# Patient Record
Sex: Female | Born: 1995
Health system: Southern US, Community
[De-identification: ages and names within clinical notes are randomized; demographics above are authoritative.]

## PROBLEM LIST (undated history)

## (undated) DIAGNOSIS — A599 Trichomoniasis, unspecified: Secondary | ICD-10-CM

## (undated) DIAGNOSIS — A749 Chlamydial infection, unspecified: Secondary | ICD-10-CM

## (undated) HISTORY — PX: MOUTH SURGERY: SHX715

---

## 2014-07-15 ENCOUNTER — Encounter (HOSPITAL_BASED_OUTPATIENT_CLINIC_OR_DEPARTMENT_OTHER): Payer: Self-pay | Admitting: *Deleted

## 2014-07-15 DIAGNOSIS — N390 Urinary tract infection, site not specified: Secondary | ICD-10-CM | POA: Insufficient documentation

## 2014-07-15 DIAGNOSIS — Z3202 Encounter for pregnancy test, result negative: Secondary | ICD-10-CM | POA: Insufficient documentation

## 2014-07-15 DIAGNOSIS — N200 Calculus of kidney: Secondary | ICD-10-CM | POA: Insufficient documentation

## 2014-07-15 LAB — URINE MICROSCOPIC-ADD ON

## 2014-07-15 LAB — URINALYSIS, ROUTINE W REFLEX MICROSCOPIC
BILIRUBIN URINE: NEGATIVE
Glucose, UA: NEGATIVE mg/dL
Hgb urine dipstick: NEGATIVE
Ketones, ur: 15 mg/dL — AB
NITRITE: NEGATIVE
Protein, ur: NEGATIVE mg/dL
SPECIFIC GRAVITY, URINE: 1.033 — AB (ref 1.005–1.030)
UROBILINOGEN UA: 1 mg/dL (ref 0.0–1.0)
pH: 6 (ref 5.0–8.0)

## 2014-07-15 LAB — PREGNANCY, URINE: Preg Test, Ur: NEGATIVE

## 2014-07-15 NOTE — ED Notes (Signed)
Pt c/o freq painful urination x 3 days 

## 2014-07-16 ENCOUNTER — Emergency Department (HOSPITAL_BASED_OUTPATIENT_CLINIC_OR_DEPARTMENT_OTHER)
Admission: EM | Admit: 2014-07-16 | Discharge: 2014-07-16 | Disposition: A | Discharge status: Home / Self Care | Payer: Self-pay | Attending: Emergency Medicine | Admitting: Emergency Medicine

## 2014-07-16 DIAGNOSIS — N12 Tubulo-interstitial nephritis, not specified as acute or chronic: Secondary | ICD-10-CM

## 2014-07-16 DIAGNOSIS — N390 Urinary tract infection, site not specified: Secondary | ICD-10-CM

## 2014-07-16 MED ORDER — PHENAZOPYRIDINE HCL 100 MG PO TABS
95.0000 mg | ORAL_TABLET | Freq: Once | ORAL | Status: AC
  Administered 2014-07-16: 100 mg via ORAL
  Filled 2014-07-16: qty 1

## 2014-07-16 MED ORDER — SULFAMETHOXAZOLE-TRIMETHOPRIM 800-160 MG PO TABS: 1.0000 | ORAL_TABLET | Freq: Two times a day (BID) | ORAL | Status: DC

## 2014-07-16 MED ORDER — PHENAZOPYRIDINE HCL 95 MG PO TABS: 95.0000 mg | ORAL_TABLET | Freq: Three times a day (TID) | ORAL | Status: DC

## 2014-07-16 MED ORDER — SULFAMETHOXAZOLE-TRIMETHOPRIM 800-160 MG PO TABS
1.0000 | ORAL_TABLET | Freq: Once | ORAL | Status: AC
  Administered 2014-07-16: 1 via ORAL
  Filled 2014-07-16: qty 1

## 2014-07-16 NOTE — ED Provider Notes (Signed)
CSN: [id number]     Arrival date & time 07/15/14  2314 History   First MD Initiated Contact with Patient 07/16/14 [phone]     Chief Complaint  Patient presents with  . Dysuria     (Consider location/radiation/quality/duration/timing/severity/associated sxs/prior Treatment) Patient is a 19 y.o. female presenting with dysuria. The history is provided by the patient.  Dysuria Pain quality:  Burning Pain severity:  Moderate Onset quality:  Gradual Duration:  3 days Timing:  Constant Progression:  Unchanged Chronicity:  New Recent urinary tract infections: no   Relieved by:  Nothing Worsened by:  Nothing tried Associated symptoms: abdominal pain and fever (tactile)   Associated symptoms: no nausea and no vomiting     History reviewed. No pertinent past medical history. History reviewed. No pertinent past surgical history. History reviewed. No pertinent family history. History  Substance Use Topics  . Smoking status: Never Smoker   . Smokeless tobacco: Not on file  . Alcohol Use: No   OB History    No data available     Review of Systems  Constitutional: Positive for fever (tactile).  Respiratory: Negative for cough and shortness of breath.   Gastrointestinal: Positive for abdominal pain. Negative for nausea and vomiting.  Genitourinary: Positive for dysuria and urgency.  All other systems reviewed and are negative.     Allergies  Naproxen sodium  Home Medications   Prior to Admission medications   Medication Sig Start Date End Date Taking? Authorizing Provider  phenazopyridine (PYRIDIUM) 95 MG tablet Take 1 tablet (95 mg total) by mouth 3 (three) times daily with meals. 07/16/14   Elwin MochaBlair Lyfe Monger, MD  sulfamethoxazole-trimethoprim (BACTRIM DS,SEPTRA DS) 800-160 MG per tablet Take 1 tablet by mouth 2 (two) times daily. 07/16/14   Elwin MochaBlair Angeliz Settlemyre, MD   BP 117/68 mmHg  Pulse 67  Temp(Src) 99 F (37.2 C) (Oral)  Resp 16  Ht 5\' 5"  (1.651 m)  Wt 180 lb (81.647 kg)  BMI  29.95 kg/m2  SpO2 100%  LMP 06/25/2014 Physical Exam  Constitutional: She is oriented to person, place, and time. She appears well-developed and well-nourished. No distress.  HENT:  Head: Normocephalic and atraumatic.  Mouth/Throat: Oropharynx is clear and moist.  Eyes: EOM are normal. Pupils are equal, round, and reactive to light.  Neck: Normal range of motion. Neck supple.  Cardiovascular: Normal rate and regular rhythm.  Exam reveals no friction rub.   No murmur heard. Pulmonary/Chest: Effort normal and breath sounds normal. No respiratory distress. She has no wheezes. She has no rales.  Abdominal: Soft. She exhibits no distension. There is tenderness (Moderate lower abdomen, mild bilateral CVA tenderness). There is no rebound.  Musculoskeletal: Normal range of motion. She exhibits no edema.  Neurological: She is alert and oriented to person, place, and time.  Skin: No rash noted. She is not diaphoretic.  Nursing note and vitals reviewed.   ED Course  Procedures (including critical care time) Labs Review Labs Reviewed  URINALYSIS, ROUTINE W REFLEX MICROSCOPIC - Abnormal; Notable for the following:    APPearance CLOUDY (*)    Specific Gravity, Urine 1.033 (*)    Ketones, ur 15 (*)    Leukocytes, UA MODERATE (*)    All other components within normal limits  URINE MICROSCOPIC-ADD ON - Abnormal; Notable for the following:    Squamous Epithelial / LPF MANY (*)    Bacteria, UA MANY (*)    All other components within normal limits  PREGNANCY, URINE    Imaging Review  No results found.   EKG Interpretation None      MDM   Final diagnoses:  UTI (lower urinary tract infection)  Pyelonephritis    19 year old female here with dysuria, urinary urgency. Has had some intact off fevers and mild back pain. No history of UTIs. No vomiting, diarrhea. No vaginal complaints. Here patient has UTI noted on UA. She does have CVA tenderness bilaterally on exam and some generalized  lower abdominal aching. Will treat for pyelonephritis with Bactrim. Also given Pyridium for her urinary urgency.     Elwin Mocha, MD 07/16/14 [phone]

## 2014-07-16 NOTE — Discharge Instructions (Signed)
Pyelonephritis, Adult °Pyelonephritis is a kidney infection. In general, there are 2 main types of pyelonephritis: °· Infections that come on quickly without any warning (acute pyelonephritis). °· Infections that persist for a long period of time (chronic pyelonephritis). °CAUSES  °Two main causes of pyelonephritis are: °· Bacteria traveling from the bladder to the kidney. This is a problem especially in pregnant women. The urine in the bladder can become filled with bacteria from multiple causes, including: °¨ Inflammation of the prostate gland (prostatitis). °¨ Sexual intercourse in females. °¨ Bladder infection (cystitis). °· Bacteria traveling from the bloodstream to the tissue part of the kidney. °Problems that may increase your risk of getting a kidney infection include: °· Diabetes. °· Kidney stones or bladder stones. °· Cancer. °· Catheters placed in the bladder. °· Other abnormalities of the kidney or ureter. °SYMPTOMS  °· Abdominal pain. °· Pain in the side or flank area. °· Fever. °· Chills. °· Upset stomach. °· Blood in the urine (dark urine). °· Frequent urination. °· Strong or persistent urge to urinate. °· Burning or stinging when urinating. °DIAGNOSIS  °Your caregiver may diagnose your kidney infection based on your symptoms. A urine sample may also be taken. °TREATMENT  °In general, treatment depends on how severe the infection is.  °· If the infection is mild and caught early, your caregiver may treat you with oral antibiotics and send you home. °· If the infection is more severe, the bacteria may have gotten into the bloodstream. This will require intravenous (IV) antibiotics and a hospital stay. Symptoms may include: °¨ High fever. °¨ Severe flank pain. °¨ Shaking chills. °· Even after a hospital stay, your caregiver may require you to be on oral antibiotics for a period of time. °· Other treatments may be required depending upon the cause of the infection. °HOME CARE INSTRUCTIONS  °· Take your  antibiotics as directed. Finish them even if you start to feel better. °· Make an appointment to have your urine checked to make sure the infection is gone. °· Drink enough fluids to keep your urine clear or pale yellow. °· Take medicines for the bladder if you have urgency and frequency of urination as directed by your caregiver. °SEEK IMMEDIATE MEDICAL CARE IF:  °· You have a fever or persistent symptoms for more than 2-3 days. °· You have a fever and your symptoms suddenly get worse. °· You are unable to take your antibiotics or fluids. °· You develop shaking chills. °· You experience extreme weakness or fainting. °· There is no improvement after 2 days of treatment. °MAKE SURE YOU: °· Understand these instructions. °· Will watch your condition. °· Will get help right away if you are not doing well or get worse. °Document Released: 03/20/2005 Document Revised: 09/19/2011 Document Reviewed: 08/24/2010 °ExitCare® Patient Information ©2015 [employer]. This information is not intended to replace advice given to you by your health care provider. Make sure you discuss any questions you have with your health care provider. ° °

## 2014-12-07 ENCOUNTER — Encounter (HOSPITAL_BASED_OUTPATIENT_CLINIC_OR_DEPARTMENT_OTHER): Payer: Self-pay

## 2014-12-07 ENCOUNTER — Emergency Department (HOSPITAL_BASED_OUTPATIENT_CLINIC_OR_DEPARTMENT_OTHER)
Admission: EM | Admit: 2014-12-07 | Discharge: 2014-12-07 | Disposition: A | Discharge status: Home / Self Care | Payer: Self-pay | Attending: Emergency Medicine | Admitting: Emergency Medicine

## 2014-12-07 DIAGNOSIS — Y9289 Other specified places as the place of occurrence of the external cause: Secondary | ICD-10-CM | POA: Insufficient documentation

## 2014-12-07 DIAGNOSIS — Y9389 Activity, other specified: Secondary | ICD-10-CM | POA: Insufficient documentation

## 2014-12-07 DIAGNOSIS — X19XXXA Contact with other heat and hot substances, initial encounter: Secondary | ICD-10-CM | POA: Insufficient documentation

## 2014-12-07 DIAGNOSIS — T23232A Burn of second degree of multiple left fingers (nail), not including thumb, initial encounter: Secondary | ICD-10-CM | POA: Insufficient documentation

## 2014-12-07 DIAGNOSIS — Y998 Other external cause status: Secondary | ICD-10-CM | POA: Insufficient documentation

## 2014-12-07 DIAGNOSIS — T23202A Burn of second degree of left hand, unspecified site, initial encounter: Secondary | ICD-10-CM

## 2014-12-07 MED ORDER — BACITRACIN 500 UNIT/GM EX OINT: 1.0000 "application " | TOPICAL_OINTMENT | Freq: Two times a day (BID) | CUTANEOUS | Status: DC
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
## 2014-12-07 NOTE — Discharge Instructions (Signed)
Burn Care Your skin is a natural barrier to infection. It is the largest organ of your body. Burns damage this natural protection. To help prevent infection, it is very important to follow your caregiver's instructions in the care of your burn. Burns are classified as:  First degree. There is only redness of the skin (erythema). No scarring is expected.  Second degree. There is blistering of the skin. Scarring may occur with deeper burns.  Third degree. All layers of the skin are injured, and scarring is expected. HOME CARE INSTRUCTIONS   Wash your hands well before changing your bandage.  Change your bandage as often as directed by your caregiver.  Remove the old bandage. If the bandage sticks, you may soak it off with cool, clean water.  Cleanse the burn thoroughly but gently with mild soap and water.  Pat the area dry with a clean, dry cloth.  Apply a thin layer of antibacterial cream to the burn.  Apply a clean bandage as instructed by your caregiver.  Keep the bandage as clean and dry as possible.  Elevate the affected area for the first 24 hours, then as instructed by your caregiver.  Only take over-the-counter or prescription medicines for pain, discomfort, or fever as directed by your caregiver. SEEK IMMEDIATE MEDICAL CARE IF:   You develop excessive pain.  You develop redness, tenderness, swelling, or red streaks near the burn.  The burned area develops yellowish-white fluid (pus) or a bad smell.  You have a fever. MAKE SURE YOU:   Understand these instructions.  Will watch your condition.  Will get help right away if you are not doing well or get worse. Document Released: 03/20/2005 Document Revised: 06/12/2011 Document Reviewed: 08/10/2010 ExitCare Patient Information 2015 [employer]. This information is not intended to replace advice given to you by your health care provider. Make sure you discuss any questions you have with your health care  provider.  
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
## 2014-12-07 NOTE — ED Notes (Signed)
Wound care discussed and pt verbalized understanding. Rx x 1 given for bacitracin

## 2015-07-12 ENCOUNTER — Emergency Department (HOSPITAL_COMMUNITY): Payer: Self-pay

## 2015-07-12 ENCOUNTER — Emergency Department (HOSPITAL_COMMUNITY)
Admission: EM | Admit: 2015-07-12 | Discharge: 2015-07-12 | Disposition: A | Discharge status: Home / Self Care | Payer: Self-pay | Attending: Emergency Medicine | Admitting: Emergency Medicine

## 2015-07-12 ENCOUNTER — Encounter (HOSPITAL_COMMUNITY): Payer: Self-pay | Admitting: Emergency Medicine

## 2015-07-12 DIAGNOSIS — N760 Acute vaginitis: Secondary | ICD-10-CM | POA: Insufficient documentation

## 2015-07-12 DIAGNOSIS — Z79899 Other long term (current) drug therapy: Secondary | ICD-10-CM | POA: Insufficient documentation

## 2015-07-12 DIAGNOSIS — N939 Abnormal uterine and vaginal bleeding, unspecified: Secondary | ICD-10-CM | POA: Insufficient documentation

## 2015-07-12 DIAGNOSIS — B9689 Other specified bacterial agents as the cause of diseases classified elsewhere: Secondary | ICD-10-CM

## 2015-07-12 DIAGNOSIS — Z3202 Encounter for pregnancy test, result negative: Secondary | ICD-10-CM | POA: Insufficient documentation

## 2015-07-12 DIAGNOSIS — R1031 Right lower quadrant pain: Secondary | ICD-10-CM

## 2015-07-12 LAB — I-STAT CHEM 8, ED
BUN: 16 mg/dL (ref 6–20)
CHLORIDE: 104 mmol/L (ref 101–111)
Calcium, Ion: 1.14 mmol/L (ref 1.12–1.23)
Creatinine, Ser: 0.8 mg/dL (ref 0.44–1.00)
GLUCOSE: 81 mg/dL (ref 65–99)
HCT: 39 % (ref 36.0–46.0)
Hemoglobin: 13.3 g/dL (ref 12.0–15.0)
POTASSIUM: 3.7 mmol/L (ref 3.5–5.1)
Sodium: 138 mmol/L (ref 135–145)
TCO2: 23 mmol/L (ref 0–100)

## 2015-07-12 LAB — URINALYSIS, ROUTINE W REFLEX MICROSCOPIC
Bilirubin Urine: NEGATIVE
Glucose, UA: NEGATIVE mg/dL
Ketones, ur: NEGATIVE mg/dL
Nitrite: NEGATIVE
Protein, ur: 30 mg/dL — AB
SPECIFIC GRAVITY, URINE: 1.039 — AB (ref 1.005–1.030)
pH: 6 (ref 5.0–8.0)

## 2015-07-12 LAB — WET PREP, GENITAL
Sperm: NONE SEEN
Trich, Wet Prep: NONE SEEN
YEAST WET PREP: NONE SEEN

## 2015-07-12 LAB — CBC
HCT: 36.3 % (ref 36.0–46.0)
HEMOGLOBIN: 12 g/dL (ref 12.0–15.0)
MCH: 28.6 pg (ref 26.0–34.0)
MCHC: 33.1 g/dL (ref 30.0–36.0)
MCV: 86.4 fL (ref 78.0–100.0)
Platelets: 417 10*3/uL — ABNORMAL HIGH (ref 150–400)
RBC: 4.2 MIL/uL (ref 3.87–5.11)
RDW: 14.2 % (ref 11.5–15.5)
WBC: 8.1 10*3/uL (ref 4.0–10.5)

## 2015-07-12 LAB — URINE MICROSCOPIC-ADD ON

## 2015-07-12 LAB — I-STAT BETA HCG BLOOD, ED (MC, WL, AP ONLY): I-stat hCG, quantitative: <5 m[IU]/mL (ref <5)

## 2015-07-12 MED ORDER — METRONIDAZOLE 500 MG PO TABS: 500.0000 mg | ORAL_TABLET | Freq: Two times a day (BID) | ORAL | Status: DC

## 2015-07-12 NOTE — ED Provider Notes (Signed)
CSN: [id number]     Arrival date & time 07/12/15  1326 History   First MD Initiated Contact with Patient 07/12/15 1621     Chief Complaint  Patient presents with  . Vaginal Bleeding     (Consider location/radiation/quality/duration/timing/severity/associated sxs/prior Treatment) HPI Comments: Patient presents to the emergency department with chief complaint of abnormal vaginal bleeding. She states that she began bleeding last night. States the bleeding was heavy last night, but it has slowed down this morning, and has nearly resolved. She states that she became concerned because she just finished her period on 3/24. She states that she is normally very regular. She reports some associated lower abdominal cramping. She denies any fevers, chills, nausea, vomiting, or diarrhea. Additionally, she reports some associated dysuria. She states that she did try to notify her OB/GYN, but was told that her insurance was not covered.  The history is provided by the patient. No language interpreter was used.    History reviewed. No pertinent past medical history. Past Surgical History  Procedure Laterality Date  . Mouth surgery     History reviewed. No pertinent family history. Social History  Substance Use Topics  . Smoking status: Never Smoker   . Smokeless tobacco: None  . Alcohol Use: No   OB History    No data available     Review of Systems  Constitutional: Negative for fever and chills.  Respiratory: Negative for shortness of breath.   Cardiovascular: Negative for chest pain.  Gastrointestinal: Negative for nausea, vomiting, diarrhea and constipation.  Genitourinary: Positive for vaginal bleeding. Negative for dysuria.  All other systems reviewed and are negative.     Allergies  Naproxen sodium  Home Medications   Prior to Admission medications   Medication Sig Start Date End Date Taking? Authorizing Provider  LO LOESTRIN FE 1 MG-10 MCG / 10 MCG tablet Take 1 tablet by  mouth daily. 06/29/15   Historical Provider, MD   BP 109/72 mmHg  Pulse 60  Temp(Src) 98.3 F (36.8 C) (Oral)  Resp 14  SpO2 98%  LMP 06/25/2015 Physical Exam  Constitutional: She is oriented to person, place, and time. She appears well-developed and well-nourished.  HENT:  Head: Normocephalic and atraumatic.  Eyes: Conjunctivae and EOM are normal. Pupils are equal, round, and reactive to light.  Neck: Normal range of motion. Neck supple.  Cardiovascular: Normal rate and regular rhythm.  Exam reveals no gallop and no friction rub.   No murmur heard. Pulmonary/Chest: Effort normal and breath sounds normal. No respiratory distress. She has no wheezes. She has no rales. She exhibits no tenderness.  Abdominal: Soft. Bowel sounds are normal. She exhibits no distension and no mass. There is no tenderness. There is no rebound and no guarding.  Genitourinary:  Pelvic exam chaperoned by female ER tech, mild right adnexal tenderness, no left adnexal tenderness, no uterine tenderness, no vaginal discharge, moderate old blood, no active bleeding, no CMT or friability, no foreign body, no injury to the external genitalia, no other significant findings   Musculoskeletal: Normal range of motion. She exhibits no edema or tenderness.  Neurological: She is alert and oriented to person, place, and time.  Skin: Skin is warm and dry.  Psychiatric: She has a normal mood and affect. Her behavior is normal. Judgment and thought content normal.  Nursing note and vitals reviewed.   ED Course  Procedures (including critical care time) Labs Review Labs Reviewed  WET PREP, GENITAL - Abnormal; Notable for the following:  Clue Cells Wet Prep HPF POC PRESENT (*)    WBC, Wet Prep HPF POC MODERATE (*)    All other components within normal limits  CBC - Abnormal; Notable for the following:    Platelets 417 (*)    All other components within normal limits  URINALYSIS, ROUTINE W REFLEX MICROSCOPIC (NOT AT [facility])  - Abnormal; Notable for the following:    Color, Urine AMBER (*)    APPearance CLOUDY (*)    Specific Gravity, Urine 1.039 (*)    Hgb urine dipstick SMALL (*)    Protein, ur 30 (*)    Leukocytes, UA LARGE (*)    All other components within normal limits  URINE MICROSCOPIC-ADD ON - Abnormal; Notable for the following:    Squamous Epithelial / LPF 0-5 (*)    Bacteria, UA MANY (*)    All other components within normal limits  I-STAT CHEM 8, ED  I-STAT BETA HCG BLOOD, ED ([facility], [facility], [facility] ONLY)  GC/CHLAMYDIA PROBE AMP ([place]) NOT AT [facility]    Imaging Review [place] Transvaginal Non-ob  07/12/2015  CLINICAL DATA:  RIGHT lower quadrant abdominal pain, heavy bleeding since last night EXAM: TRANSABDOMINAL AND TRANSVAGINAL ULTRASOUND OF PELVIS DOPPLER ULTRASOUND OF OVARIES TECHNIQUE: Both transabdominal and transvaginal ultrasound examinations of the pelvis were performed. Transabdominal technique was performed for global imaging of the pelvis including uterus, ovaries, adnexal regions, and pelvic cul-de-sac. It was necessary to proceed with endovaginal exam following the transabdominal exam to visualize the endometrium and ovaries. Color and duplex Doppler ultrasound was utilized to evaluate blood flow to the ovaries. COMPARISON:  None FINDINGS: Uterus Measurements: 4.6 x 2.3 x 3.7 cm. Normal morphology without mass. Endometrium Thickness: 1 mm thick. Tiny amount of endometrial fluid. No other focal abnormalities. Right ovary Measurements: 2.1 x 1.8 x 2.7 cm. Normal morphology without mass. Internal blood flow present on color Doppler imaging. Left ovary Measurements: 2.0 x 2.5 x 2.7 cm. Normal morphology without mass. Internal blood flow present on color Doppler imaging. Pulsed Doppler evaluation of both ovaries demonstrates normal low-resistance arterial and venous waveforms. Other findings Trace free pelvic fluid. No adnexal masses. IMPRESSION: Tiny amount of nonspecific endometrial fluid. Otherwise normal  exam including duplex assessment of the ovaries. Electronically Signed   By: Ulyses SouthwardMark  Boles M.D.   On: 07/12/2015 19:44   [place] Pelvis Complete  07/12/2015  CLINICAL DATA:  RIGHT lower quadrant abdominal pain, heavy bleeding since last night EXAM: TRANSABDOMINAL AND TRANSVAGINAL ULTRASOUND OF PELVIS DOPPLER ULTRASOUND OF OVARIES TECHNIQUE: Both transabdominal and transvaginal ultrasound examinations of the pelvis were performed. Transabdominal technique was performed for global imaging of the pelvis including uterus, ovaries, adnexal regions, and pelvic cul-de-sac. It was necessary to proceed with endovaginal exam following the transabdominal exam to visualize the endometrium and ovaries. Color and duplex Doppler ultrasound was utilized to evaluate blood flow to the ovaries. COMPARISON:  None FINDINGS: Uterus Measurements: 4.6 x 2.3 x 3.7 cm. Normal morphology without mass. Endometrium Thickness: 1 mm thick. Tiny amount of endometrial fluid. No other focal abnormalities. Right ovary Measurements: 2.1 x 1.8 x 2.7 cm. Normal morphology without mass. Internal blood flow present on color Doppler imaging. Left ovary Measurements: 2.0 x 2.5 x 2.7 cm. Normal morphology without mass. Internal blood flow present on color Doppler imaging. Pulsed Doppler evaluation of both ovaries demonstrates normal low-resistance arterial and venous waveforms. Other findings Trace free pelvic fluid. No adnexal masses. IMPRESSION: Tiny amount of nonspecific endometrial fluid. Otherwise normal exam including duplex assessment  of the ovaries. Electronically Signed   By: Ulyses Southward M.D.   On: 07/12/2015 19:44   [place] Art/ven Flow Abd Pelv Doppler  07/12/2015  CLINICAL DATA:  RIGHT lower quadrant abdominal pain, heavy bleeding since last night EXAM: TRANSABDOMINAL AND TRANSVAGINAL ULTRASOUND OF PELVIS DOPPLER ULTRASOUND OF OVARIES TECHNIQUE: Both transabdominal and transvaginal ultrasound examinations of the pelvis were performed. Transabdominal  technique was performed for global imaging of the pelvis including uterus, ovaries, adnexal regions, and pelvic cul-de-sac. It was necessary to proceed with endovaginal exam following the transabdominal exam to visualize the endometrium and ovaries. Color and duplex Doppler ultrasound was utilized to evaluate blood flow to the ovaries. COMPARISON:  None FINDINGS: Uterus Measurements: 4.6 x 2.3 x 3.7 cm. Normal morphology without mass. Endometrium Thickness: 1 mm thick. Tiny amount of endometrial fluid. No other focal abnormalities. Right ovary Measurements: 2.1 x 1.8 x 2.7 cm. Normal morphology without mass. Internal blood flow present on color Doppler imaging. Left ovary Measurements: 2.0 x 2.5 x 2.7 cm. Normal morphology without mass. Internal blood flow present on color Doppler imaging. Pulsed Doppler evaluation of both ovaries demonstrates normal low-resistance arterial and venous waveforms. Other findings Trace free pelvic fluid. No adnexal masses. IMPRESSION: Tiny amount of nonspecific endometrial fluid. Otherwise normal exam including duplex assessment of the ovaries. Electronically Signed   By: Ulyses Southward M.D.   On: 07/12/2015 19:44   I have personally reviewed and evaluated these images and lab results as part of my medical decision-making.   MDM   Final diagnoses:  Abnormal uterine bleeding  BV (bacterial vaginosis)    Patient with vaginal bleeding since yesterday.  No focal abdominal tenderness.  Will check labs and pelvic exam.  No active bleeding on pelvic exam. She does have some right-sided adnexal tenderness, will check pelvic ultrasound. hCG is negative.  There are clue cells present on wet prep. We'll need to treat for bacterial vaginosis.  Hemoglobin and hematocrit are normal. Vital signs stable. Ultrasound is negative. Given return precautions and follow-up instructions.    Roxy Horseman, PA-C 07/12/15 1958  Raeford Razor, MD 07/13/15 [id number]

## 2015-07-12 NOTE — Discharge Instructions (Signed)
Abnormal Uterine Bleeding °Abnormal uterine bleeding can affect women at various stages in life, including teenagers, women in their reproductive years, pregnant women, and women who have reached menopause. Several kinds of uterine bleeding are considered abnormal, including: °· Bleeding or spotting between periods.   °· Bleeding after sexual intercourse.   °· Bleeding that is heavier or more than normal.   °· Periods that last longer than usual. °· Bleeding after menopause.   °Many cases of abnormal uterine bleeding are minor and simple to treat, while others are more serious. Any type of abnormal bleeding should be evaluated by your health care provider. Treatment will depend on the cause of the bleeding. °HOME CARE INSTRUCTIONS °Monitor your condition for any changes. The following actions may help to alleviate any discomfort you are experiencing: °· Avoid the use of tampons and douches as directed by your health care provider. °· Change your pads frequently. °You should get regular pelvic exams and Pap tests. Keep all follow-up appointments for diagnostic tests as directed by your health care provider.  °SEEK MEDICAL CARE IF:  °· Your bleeding lasts more than 1 week.   °· You feel dizzy at times.   °SEEK IMMEDIATE MEDICAL CARE IF:  °· You pass out.   °· You are changing pads every 15 to 30 minutes.   °· You have abdominal pain. °· You have a fever.   °· You become sweaty or weak.   °· You are passing large blood clots from the vagina.   °· You start to feel nauseous and vomit. °MAKE SURE YOU:  °· Understand these instructions. °· Will watch your condition. °· Will get help right away if you are not doing well or get worse. °  °This information is not intended to replace advice given to you by your health care provider. Make sure you discuss any questions you have with your health care provider. °  °Document Released: 03/20/2005 Document Revised: 03/25/2013 Document Reviewed: 10/17/2012 °Elsevier Interactive  Patient Education ©2016 Elsevier Inc. °Bacterial Vaginosis °Bacterial vaginosis is a vaginal infection that occurs when the normal balance of bacteria in the vagina is disrupted. It results from an overgrowth of certain bacteria. This is the most common vaginal infection in women of childbearing age. Treatment is important to prevent complications, especially in pregnant women, as it can cause a premature delivery. °CAUSES  °Bacterial vaginosis is caused by an increase in harmful bacteria that are normally present in smaller amounts in the vagina. Several different kinds of bacteria can cause bacterial vaginosis. However, the reason that the condition develops is not fully understood. °RISK FACTORS °Certain activities or behaviors can put you at an increased risk of developing bacterial vaginosis, including: °· Having a new sex partner or multiple sex partners. °· Douching. °· Using an intrauterine device (IUD) for contraception. °Women do not get bacterial vaginosis from toilet seats, bedding, swimming pools, or contact with objects around them. °SIGNS AND SYMPTOMS  °Some women with bacterial vaginosis have no signs or symptoms. Common symptoms include: °· Grey vaginal discharge. °· A fishlike odor with discharge, especially after sexual intercourse. °· Itching or burning of the vagina and vulva. °· Burning or pain with urination. °DIAGNOSIS  °Your health care provider will take a medical history and examine the vagina for signs of bacterial vaginosis. A sample of vaginal fluid may be taken. Your health care provider will look at this sample under a microscope to check for bacteria and abnormal cells. A vaginal pH test may also be done.  °TREATMENT  °Bacterial vaginosis may be treated   with antibiotic medicines. These may be given in the form of a pill or a vaginal cream. A second round of antibiotics may be prescribed if the condition comes back after treatment. Because bacterial vaginosis increases your risk for  sexually transmitted diseases, getting treated can help reduce your risk for chlamydia, gonorrhea, HIV, and herpes. °HOME CARE INSTRUCTIONS  °· Only take over-the-counter or prescription medicines as directed by your health care provider. °· If antibiotic medicine was prescribed, take it as directed. Make sure you finish it even if you start to feel better. °· Tell all sexual partners that you have a vaginal infection. They should see their health care provider and be treated if they have problems, such as a mild rash or itching. °· During treatment, it is important that you follow these instructions: °¨ Avoid sexual activity or use condoms correctly. °¨ Do not douche. °¨ Avoid alcohol as directed by your health care provider. °¨ Avoid breastfeeding as directed by your health care provider. °SEEK MEDICAL CARE IF:  °· Your symptoms are not improving after 3 days of treatment. °· You have increased discharge or pain. °· You have a fever. °MAKE SURE YOU:  °· Understand these instructions. °· Will watch your condition. °· Will get help right away if you are not doing well or get worse. °FOR MORE INFORMATION  °Centers for Disease Control and Prevention, Division of STD Prevention: www.cdc.gov/std °American Sexual Health Association (ASHA): www.ashastd.org  °  °This information is not intended to replace advice given to you by your health care provider. Make sure you discuss any questions you have with your health care provider. °  °Document Released: 03/20/2005 Document Revised: 04/10/2014 Document Reviewed: 10/30/2012 °Elsevier Interactive Patient Education ©2016 Elsevier Inc. ° °

## 2015-07-12 NOTE — Progress Notes (Signed)
Patient listed as not having a pcp or insurance.  EDCM spoke to patient at bedside.  Patient reports she has Medicaid Family Planning and sees her obgyn Dr. Ferdinand C[place]Arus in [place].  Patient does not have a pcp.  [facility], informed patient of services there.  EDCM also provided patient with list of pcps who accept self pay patients, list of discount pharmacies and websites needymeds.org and GoodRX.com for medication assistance, phone number to inquire about the orange card, phone number to inquire about Mediciad, phone number to inquire about the Affordable Care Act, financial resources in the community such as local churches, salvation [employer], urban ministries, and dental assistance for uninsured patients.  Patient thankful for resources.  No further EDCM needs at this time.

## 2015-07-12 NOTE — ED Notes (Signed)
Pt states heavy vaginal bleeding beginning last night into today. States it's not time for her period, and that those do come fairly regularly. Last period being March 24th. Pt states she noticed one larger clot in the bleeding. States she does occasionally have unprotected sex with her boyfriend.

## 2015-07-13 ENCOUNTER — Emergency Department (HOSPITAL_BASED_OUTPATIENT_CLINIC_OR_DEPARTMENT_OTHER)
Admission: EM | Admit: 2015-07-13 | Discharge: 2015-07-13 | Disposition: A | Discharge status: Home / Self Care | Payer: Medicaid Other | Attending: Emergency Medicine | Admitting: Emergency Medicine

## 2015-07-13 ENCOUNTER — Encounter (HOSPITAL_BASED_OUTPATIENT_CLINIC_OR_DEPARTMENT_OTHER): Payer: Self-pay | Admitting: *Deleted

## 2015-07-13 DIAGNOSIS — M791 Myalgia: Secondary | ICD-10-CM | POA: Insufficient documentation

## 2015-07-13 DIAGNOSIS — Z792 Long term (current) use of antibiotics: Secondary | ICD-10-CM | POA: Insufficient documentation

## 2015-07-13 DIAGNOSIS — H9201 Otalgia, right ear: Secondary | ICD-10-CM | POA: Insufficient documentation

## 2015-07-13 DIAGNOSIS — J020 Streptococcal pharyngitis: Secondary | ICD-10-CM | POA: Insufficient documentation

## 2015-07-13 LAB — GC/CHLAMYDIA PROBE AMP (~~LOC~~) NOT AT ARMC
CHLAMYDIA, DNA PROBE: NEGATIVE
Neisseria Gonorrhea: POSITIVE — AB

## 2015-07-13 LAB — RAPID STREP SCREEN (MED CTR MEBANE ONLY): STREPTOCOCCUS, GROUP A SCREEN (DIRECT): POSITIVE — AB

## 2015-07-13 MED ORDER — ACETAMINOPHEN 325 MG PO TABS
650.0000 mg | ORAL_TABLET | Freq: Once | ORAL | Status: AC
  Administered 2015-07-13: 650 mg via ORAL
  Filled 2015-07-13: qty 2

## 2015-07-13 MED ORDER — MAGIC MOUTHWASH
ORAL | Status: AC
  Filled 2015-07-13: qty 10

## 2015-07-13 MED ORDER — PENICILLIN G BENZATHINE 1200000 UNIT/2ML IM SUSP
1.2000 10*6.[IU] | Freq: Once | INTRAMUSCULAR | Status: AC
  Administered 2015-07-13: 1.2 10*6.[IU] via INTRAMUSCULAR
  Filled 2015-07-13: qty 2

## 2015-07-13 MED ORDER — KETOROLAC TROMETHAMINE 30 MG/ML IJ SOLN
30.0000 mg | Freq: Once | INTRAMUSCULAR | Status: AC
Start: 2015-07-13 — End: 2015-07-13
  Administered 2015-07-13: 30 mg via INTRAMUSCULAR
  Filled 2015-07-13: qty 1

## 2015-07-13 MED ORDER — PENICILLIN G BENZATHINE 1200000 UNIT/2ML IM SUSP
2.4000 10*6.[IU] | Freq: Once | INTRAMUSCULAR | Status: DC
  Filled 2015-07-13: qty 4

## 2015-07-13 MED ORDER — MAGIC MOUTHWASH
5.0000 mL | Freq: Once | ORAL | Status: AC
  Administered 2015-07-13: 5 mL via ORAL

## 2015-07-13 NOTE — ED Provider Notes (Signed)
CSN: [id number]     Arrival date & time 07/13/15  0758 History   First MD Initiated Contact with Patient 07/13/15 [id number]     No chief complaint on file.    (Consider location/radiation/quality/duration/timing/severity/associated sxs/prior Treatment) HPI Comments: 20 year old female with no significant past medical history presents for throat pain and myalgias. The patient reports that she felt okay yesterday morning when [facility]. She says that since last night she has developed severe pain in her throat worse on the right side and pain in her right ear. She reports feeling feverish, having body aches, having chills. Her roommate was sick recently with similar symptoms. Denies vomiting. No abdominal pain. No cough.   History reviewed. No pertinent past medical history. Past Surgical History  Procedure Laterality Date  . Mouth surgery     No family history on file. Social History  Substance Use Topics  . Smoking status: Never Smoker   . Smokeless tobacco: None  . Alcohol Use: No   OB History    No data available     Review of Systems  Constitutional: Positive for fever, chills and fatigue.  HENT: Positive for ear pain and sore throat. Negative for congestion, postnasal drip, sinus pressure, sneezing, trouble swallowing and voice change.   Eyes: Negative for pain and redness.  Respiratory: Negative for cough, shortness of breath and wheezing.   Gastrointestinal: Negative for nausea, vomiting, abdominal pain and diarrhea.  Genitourinary: Negative for dysuria, urgency and hematuria.  Musculoskeletal: Positive for myalgias. Negative for back pain.  Skin: Negative for rash.  Neurological: Negative for dizziness, seizures, weakness and numbness.  Hematological: Does not bruise/bleed easily.      Allergies  Naproxen sodium  Home Medications   Prior to Admission medications   Medication Sig Start Date End Date Taking? Authorizing Provider   LO LOESTRIN FE 1 MG-10 MCG / 10 MCG tablet Take 1 tablet by mouth every evening.  06/29/15  Yes Historical Provider, MD  metroNIDAZOLE (FLAGYL) 500 MG tablet Take 1 tablet (500 mg total) by mouth 2 (two) times daily. 07/12/15  Yes Roxy Horsemanobert Browning, PA-C   BP 122/68 mmHg  Pulse 96  Temp(Src) 100.3 F (37.9 C) (Oral)  Resp 20  SpO2 100%  LMP 06/25/2015 Physical Exam  Constitutional: She is oriented to person, place, and time. She appears well-developed and well-nourished. No distress.  HENT:  Head: Normocephalic and atraumatic.  Right Ear: Tympanic membrane and external ear normal.  Left Ear: Tympanic membrane and external ear normal.  Nose: Nose normal.  Mouth/Throat: Uvula is midline. Mucous membranes are not dry. Oropharyngeal exudate (ovre the right tonsil) and posterior oropharyngeal erythema present. No posterior oropharyngeal edema or tonsillar abscesses.  Eyes: EOM are normal. Pupils are equal, round, and reactive to light.  Neck: Normal range of motion. Neck supple.  Cardiovascular: Normal rate, regular rhythm, normal heart sounds and intact distal pulses.   No murmur heard. Pulmonary/Chest: Effort normal. No respiratory distress. She has no wheezes. She has no rales.  Abdominal: Soft. She exhibits no distension. There is no tenderness.  Musculoskeletal: Normal range of motion. She exhibits no edema or tenderness.  Neurological: She is alert and oriented to person, place, and time.  Skin: Skin is warm and dry. No rash noted. She is not diaphoretic.  Vitals reviewed.   ED Course  Procedures (including critical care time) Labs Review Labs Reviewed  RAPID STREP SCREEN (NOT AT [facility]) - Abnormal; Notable for the following:  Streptococcus, Group A Screen (Direct) POSITIVE (*)    All other components within normal limits    Imaging Review None  I have personally reviewed and evaluated these images and lab results as part of my medical decision-making.   EKG  Interpretation None      MDM  Patient was seen and evaluated in stable condition. Strep positive. Patient was given Toradol as well as penicillin. She was discharged home in stable condition. Final diagnoses:  None    1. Strep throat    Leta Baptist, MD 07/13/15 [phone]

## 2015-07-13 NOTE — Discharge Instructions (Signed)
You were seen and evaluated today for your body aches and sore throat. You have a strep throat. You were given medication in the emergency department that we'll continue to work to fight the infection. There is no need to fill a prescription for antibiotics. He is Tylenol and Motrin to help with symptom control. Keep herself well-hydrated with clear sodas, Gatorade, water.  Strep Throat Strep throat is a bacterial infection of the throat. Your health care provider may call the infection tonsillitis or pharyngitis, depending on whether there is swelling in the tonsils or at the back of the throat. Strep throat is most common during the cold months of the year in children who are 51-59 years of age, but it can happen during any season in people of any age. This infection is spread from person to person (contagious) through coughing, sneezing, or close contact. CAUSES Strep throat is caused by the bacteria called Streptococcus pyogenes. RISK FACTORS This condition is more likely to develop in:  People who spend time in crowded places where the infection can spread easily.  People who have close contact with someone who has strep throat. SYMPTOMS Symptoms of this condition include:  Fever or chills.   Redness, swelling, or pain in the tonsils or throat.  Pain or difficulty when swallowing.  White or yellow spots on the tonsils or throat.  Swollen, tender glands in the neck or under the jaw.  Red rash all over the body (rare). DIAGNOSIS This condition is diagnosed by performing a rapid strep test or by taking a swab of your throat (throat culture test). Results from a rapid strep test are usually ready in a few minutes, but throat culture test results are available after one or two days. TREATMENT This condition is treated with antibiotic medicine. HOME CARE INSTRUCTIONS Medicines  Take over-the-counter and prescription medicines only as told by your health care provider.  Take your  antibiotic as told by your health care provider. Do not stop taking the antibiotic even if you start to feel better.  Have family members who also have a sore throat or fever tested for strep throat. They may need antibiotics if they have the strep infection. Eating and Drinking  Do not share food, drinking cups, or personal items that could cause the infection to spread to other people.  If swallowing is difficult, try eating soft foods until your sore throat feels better.  Drink enough fluid to keep your urine clear or pale yellow. General Instructions  Gargle with a salt-water mixture 3-4 times per day or as needed. To make a salt-water mixture, completely dissolve -1 tsp of salt in 1 cup of warm water.  Make sure that all household members wash their hands well.  Get plenty of rest.  Stay home from school or work until you have been taking antibiotics for 24 hours.  Keep all follow-up visits as told by your health care provider. This is important. SEEK MEDICAL CARE IF:  The glands in your neck continue to get bigger.  You develop a rash, cough, or earache.  You cough up a thick liquid that is green, yellow-brown, or bloody.  You have pain or discomfort that does not get better with medicine.  Your problems seem to be getting worse rather than better.  You have a fever. SEEK IMMEDIATE MEDICAL CARE IF:  You have new symptoms, such as vomiting, severe headache, stiff or painful neck, chest pain, or shortness of breath.  You have severe throat pain,  drooling, or changes in your voice.  You have swelling of the neck, or the skin on the neck becomes red and tender.  You have signs of dehydration, such as fatigue, dry mouth, and decreased urination.  You become increasingly sleepy, or you cannot wake up completely.  Your joints become red or painful.   This information is not intended to replace advice given to you by your health care provider. Make sure you discuss any  questions you have with your health care provider.   Document Released: 03/17/2000 Document Revised: 12/09/2014 Document Reviewed: 07/13/2014 Elsevier Interactive Patient Education [employer]2016 Elsevier Inc.

## 2015-07-13 NOTE — ED Notes (Addendum)
C/o throat, right ear pain and general body aches, onset last pm. No n/v/d. No cough. C/o light brown drainage from right ear. [facility] bacterial vag[facility]osis and given prescription and has not filled it yet.

## 2015-07-14 ENCOUNTER — Telehealth (HOSPITAL_BASED_OUTPATIENT_CLINIC_OR_DEPARTMENT_OTHER): Payer: Self-pay | Admitting: Emergency Medicine

## 2015-07-14 NOTE — Telephone Encounter (Signed)
Chart handoff to EDP for treatment plan, + GC

## 2016-10-17 ENCOUNTER — Emergency Department (HOSPITAL_BASED_OUTPATIENT_CLINIC_OR_DEPARTMENT_OTHER)
Admission: EM | Admit: 2016-10-17 | Discharge: 2016-10-17 | Disposition: A | Discharge status: Home / Self Care | Payer: Medicaid Other | Attending: Emergency Medicine | Admitting: Emergency Medicine

## 2016-10-17 ENCOUNTER — Encounter (HOSPITAL_BASED_OUTPATIENT_CLINIC_OR_DEPARTMENT_OTHER): Payer: Self-pay | Admitting: *Deleted

## 2016-10-17 DIAGNOSIS — M791 Myalgia: Secondary | ICD-10-CM | POA: Insufficient documentation

## 2016-10-17 DIAGNOSIS — M7918 Myalgia, other site: Secondary | ICD-10-CM

## 2016-10-17 DIAGNOSIS — Z79899 Other long term (current) drug therapy: Secondary | ICD-10-CM | POA: Insufficient documentation

## 2016-10-17 DIAGNOSIS — B9689 Other specified bacterial agents as the cause of diseases classified elsewhere: Secondary | ICD-10-CM

## 2016-10-17 DIAGNOSIS — R300 Dysuria: Secondary | ICD-10-CM

## 2016-10-17 DIAGNOSIS — N760 Acute vaginitis: Secondary | ICD-10-CM | POA: Insufficient documentation

## 2016-10-17 LAB — URINALYSIS, ROUTINE W REFLEX MICROSCOPIC
Bilirubin Urine: NEGATIVE
Glucose, UA: NEGATIVE mg/dL
Ketones, ur: 15 mg/dL — AB
NITRITE: NEGATIVE
Protein, ur: 100 mg/dL — AB
Specific Gravity, Urine: 1.022 (ref 1.005–1.030)
pH: 5.5 (ref 5.0–8.0)

## 2016-10-17 LAB — WET PREP, GENITAL
Sperm: NONE SEEN
Trich, Wet Prep: NONE SEEN
Yeast Wet Prep HPF POC: NONE SEEN

## 2016-10-17 LAB — URINALYSIS, MICROSCOPIC (REFLEX)

## 2016-10-17 LAB — PREGNANCY, URINE: PREG TEST UR: NEGATIVE

## 2016-10-17 MED ORDER — NITROFURANTOIN MONOHYD MACRO 100 MG PO CAPS: 100.0000 mg | ORAL_CAPSULE | Freq: Two times a day (BID) | ORAL | 0 refills | Status: DC

## 2016-10-17 MED ORDER — METRONIDAZOLE 0.75 % VA GEL: 1.0000 | Freq: Two times a day (BID) | VAGINAL | 0 refills | Status: AC

## 2016-10-17 NOTE — Discharge Instructions (Signed)
Use MetroGel as prescribed for 5 days. Use Macrobid as prescribed. If results of other tests are positive, you will receive a call within 3 days. If tests are negative, you will not receive a call. Stay well-hydrated. Return to the emergency department if you develops fever, chills, worsening abdominal pain, or any new or worsening symptoms.

## 2016-10-17 NOTE — ED Provider Notes (Signed)
[facility] Note   CSN: [id number] Arrival date & time: 10/17/16  1942  By signing my name below, I, Gabriella Hill, attest that this documentation has been prepared under the direction and in the presence of Gabriella Cozzens, PA-C. Electronically Signed: Linna Hill, Scribe. 10/17/2016. 8:51 PM.  History   Chief Complaint Chief Complaint  Patient presents with  . Dysuria   The history is provided by the patient. No language interpreter was used.    HPI Comments: Gabriella Hill is a 21 y.o. female who presents to the Emergency Department for evaluation of two weeks of persistent, gradually worsening dysuria. She reports some associated suprapubic pain that is worse during urination, dark urine, and back pain L > R that is most significant in the lumbar region. She is unsure of any hematuria and has not noticed any blood in the toilet bowl, but has had some blood in her underwear. She thinks it is related to her period Her back pain is worse with movement and deep breathing but is not modified by urination. No alleviating factors noted. Her appetite has been adequate and she has been hydrating well. Patient discontinued her hormone therapy a few months ago and has had irregular bowel movements since then. No personal or family h/o kidney stones. No regular medications. She denies abnormal vaginal bleeding/discharge, urinary frequency, fevers, chills, nausea, vomiting, SOB, CP, or any other associated symptoms.  Patient is also requesting STD testing. She is sexually active and uses protection. She has one partner.  History reviewed. No pertinent past medical history.  There are no active problems to display for this patient.   Past Surgical History:  Procedure Laterality Date  . MOUTH SURGERY      OB History    No data available       Home Medications    Prior to Admission medications   Medication Sig Start Date End Date Taking? Authorizing Provider    LO LOESTRIN FE 1 MG-10 MCG / 10 MCG tablet Take 1 tablet by mouth every evening.  06/29/15   [provider]  metroNIDAZOLE (FLAGYL) 500 MG tablet Take 1 tablet (500 mg total) by mouth 2 (two) times daily. 07/12/15   Roxy Horseman, PA-C  metroNIDAZOLE (METROGEL VAGINAL) 0.75 % vaginal gel Place 1 Applicatorful vaginally 2 (two) times daily. 10/17/16 10/22/16  Bryli Mantey, PA-C  nitrofurantoin, macrocrystal-monohydrate, (MACROBID) 100 MG capsule Take 1 capsule (100 mg total) by mouth 2 (two) times daily. 10/17/16   Velinda Wrobel, PA-C    Family History No family history on file.  Social History Social History  Substance Use Topics  . Smoking status: Never Smoker  . Smokeless tobacco: Never Used  . Alcohol use No     Allergies   Naproxen sodium   Review of Systems Review of Systems  Constitutional: Negative for chills and fever.  Gastrointestinal: Positive for abdominal pain. Negative for nausea and vomiting.  Genitourinary: Positive for dysuria. Negative for frequency, vaginal bleeding and vaginal discharge.  Musculoskeletal: Positive for back pain.  All other systems reviewed and are negative.  Physical Exam Updated Vital Signs BP 115/73   Pulse 67   Temp 98.3 F (36.8 C) (Oral)   Resp 16   Ht 5\' 5"  (1.651 m)   Wt 83.8 kg (184 lb 11.9 oz)   LMP 10/08/2016   SpO2 100%   BMI 30.74 kg/m   Physical Exam  Constitutional: She is oriented to person, place, and time. She appears well-developed and well-nourished.  No distress.  HENT:  Head: Normocephalic and atraumatic.  Eyes: Conjunctivae and EOM are normal.  Neck: Normal range of motion. No tracheal deviation present.  Cardiovascular: Normal rate, regular rhythm and intact distal pulses.   Pulmonary/Chest: Effort normal and breath sounds normal. No respiratory distress. She has no wheezes.  Abdominal: Soft. Bowel sounds are normal. She exhibits no distension and no mass. There is tenderness. There is no  guarding, no CVA tenderness, no tenderness at McBurney's point and negative Murphy's sign.  TTP of suprapubic area  Genitourinary: Rectum normal, vagina normal and uterus normal. Pelvic exam was performed with patient supine. There is no rash or lesion on the right labia. There is no rash or lesion on the left labia. Cervix exhibits discharge. Cervix exhibits no motion tenderness and no friability. Right adnexum displays no mass and no tenderness. Left adnexum displays no mass and no tenderness.  Genitourinary Comments: Thin white discharge from cervix with minimal blood  Musculoskeletal: Normal range of motion.  No obvious swelling or injury of back. TTP along L erector spinae muscle from midthoracic back to pelvis.   Neurological: She is alert and oriented to person, place, and time.  Skin: Skin is warm and dry.  Psychiatric: She has a normal mood and affect. Her behavior is normal.  Nursing note and vitals reviewed.  ED Treatments / Results  Labs (all labs ordered are listed, but only abnormal results are displayed) Labs Reviewed  WET PREP, GENITAL - Abnormal; Notable for the following:       Result Value   Clue Cells Wet Prep HPF POC PRESENT (*)    WBC, Wet Prep HPF POC MANY (*)    All other components within normal limits  URINALYSIS, ROUTINE W REFLEX MICROSCOPIC - Abnormal; Notable for the following:    Color, Urine AMBER (*)    APPearance CLOUDY (*)    Hgb urine dipstick LARGE (*)    Ketones, ur 15 (*)    Protein, ur 100 (*)    Leukocytes, UA MODERATE (*)    All other components within normal limits  URINALYSIS, MICROSCOPIC (REFLEX) - Abnormal; Notable for the following:    Bacteria, UA FEW (*)    Squamous Epithelial / LPF 6-30 (*)    All other components within normal limits  PREGNANCY, URINE  HIV ANTIBODY (ROUTINE TESTING)  RPR  GC/CHLAMYDIA PROBE AMP ([place]) NOT AT [facility]    EKG  EKG Interpretation None       Radiology No results  found.  Procedures Procedures (including critical care time)  DIAGNOSTIC STUDIES: Oxygen Saturation is 100% on RA, normal by my interpretation.    COORDINATION OF CARE: 8:37 PM Discussed treatment plan with pt at bedside and pt agreed to plan.  Medications Ordered in ED Medications - No data to display   Initial Impression / Assessment and Plan / ED Course  I have reviewed the triage vital signs and the nursing notes.  Pertinent labs & imaging results that were available during my care of the patient were reviewed by me and considered in my medical decision making (see chart for details).     Pt presenting with 2 wks dysuria, suprapubic pain, and L sided back pain. Back pain is consistent with musclar pain, as it runs from mid thoracic to pelvis along erector spinae. No red flags including h/o CA, IV drug use, numbness, tingling, injury, rash, headaches, or loss of bowel/bladder control. Physcial exam showed some cervical discharge and minimal blood. UA  with positive leukocytes. Wet prep positive for clue cells. Will tx pt for BV and presumed UTI, as she is having dysuria. Discussed findings with pt. Pt appears safe for discharge. Return precautions given. Pt states she understands and agrees to plan.  Final Clinical Impressions(s) / ED Diagnoses   Final diagnoses:  BV (bacterial vaginosis)  Dysuria  Musculoskeletal pain    New Prescriptions Discharge Medication List as of 10/17/2016  9:45 PM    START taking these medications   Details  metroNIDAZOLE (METROGEL VAGINAL) 0.75 % vaginal gel Place 1 Applicatorful vaginally 2 (two) times daily., Starting Tue 10/17/2016, Until Sun 10/22/2016, Print    nitrofurantoin, macrocrystal-monohydrate, (MACROBID) 100 MG capsule Take 1 capsule (100 mg total) by mouth 2 (two) times daily., Starting Tue 10/17/2016, Print       I personally performed the services described in this documentation, which was scribed in my presence. The recorded  information has been reviewed and is accurate.    Alveria ApleyCaccavale, Lannah Koike, PA-C 10/18/16 0403    Pricilla LovelessGoldston, Scott, MD 10/18/16 1536

## 2016-10-17 NOTE — ED Triage Notes (Signed)
Dysuria  X 2 weeks. Hx of UTI. Denies vaginal discharge.

## 2016-10-19 LAB — RPR: RPR Ser Ql: NONREACTIVE

## 2016-10-19 LAB — GC/CHLAMYDIA PROBE AMP (~~LOC~~) NOT AT ARMC
Chlamydia: NEGATIVE
NEISSERIA GONORRHEA: NEGATIVE

## 2016-10-19 LAB — HIV ANTIBODY (ROUTINE TESTING W REFLEX): HIV SCREEN 4TH GENERATION: NONREACTIVE

## 2017-01-12 ENCOUNTER — Emergency Department (HOSPITAL_BASED_OUTPATIENT_CLINIC_OR_DEPARTMENT_OTHER)
Admission: EM | Admit: 2017-01-12 | Discharge: 2017-01-12 | Disposition: A | Discharge status: Home / Self Care | Payer: Self-pay | Attending: Emergency Medicine | Admitting: Emergency Medicine

## 2017-01-12 ENCOUNTER — Encounter (HOSPITAL_BASED_OUTPATIENT_CLINIC_OR_DEPARTMENT_OTHER): Payer: Self-pay

## 2017-01-12 DIAGNOSIS — A599 Trichomoniasis, unspecified: Secondary | ICD-10-CM

## 2017-01-12 DIAGNOSIS — A5901 Trichomonal vulvovaginitis: Secondary | ICD-10-CM | POA: Insufficient documentation

## 2017-01-12 DIAGNOSIS — Z202 Contact with and (suspected) exposure to infections with a predominantly sexual mode of transmission: Secondary | ICD-10-CM | POA: Insufficient documentation

## 2017-01-12 LAB — URINALYSIS, ROUTINE W REFLEX MICROSCOPIC
Bilirubin Urine: NEGATIVE
GLUCOSE, UA: NEGATIVE mg/dL
HGB URINE DIPSTICK: NEGATIVE
Ketones, ur: NEGATIVE mg/dL
Nitrite: NEGATIVE
PH: 7 (ref 5.0–8.0)
Protein, ur: NEGATIVE mg/dL
Specific Gravity, Urine: <1.005 — ABNORMAL LOW (ref 1.005–1.030)

## 2017-01-12 LAB — URINALYSIS, MICROSCOPIC (REFLEX): RBC / HPF: NONE SEEN RBC/hpf (ref 0–5)

## 2017-01-12 LAB — WET PREP, GENITAL
Sperm: NONE SEEN
Yeast Wet Prep HPF POC: NONE SEEN

## 2017-01-12 LAB — PREGNANCY, URINE: Preg Test, Ur: NEGATIVE

## 2017-01-12 MED ORDER — CEPHALEXIN 250 MG PO CAPS
500.0000 mg | ORAL_CAPSULE | Freq: Once | ORAL | Status: AC
  Administered 2017-01-12: 500 mg via ORAL
  Filled 2017-01-12: qty 2

## 2017-01-12 MED ORDER — CEPHALEXIN 500 MG PO CAPS: 500.0000 mg | ORAL_CAPSULE | Freq: Two times a day (BID) | ORAL | 0 refills | Status: DC

## 2017-01-12 MED ORDER — AZITHROMYCIN 250 MG PO TABS
1000.0000 mg | ORAL_TABLET | Freq: Once | ORAL | Status: AC
  Administered 2017-01-12: 1000 mg via ORAL
  Filled 2017-01-12: qty 4

## 2017-01-12 MED ORDER — METRONIDAZOLE 500 MG PO TABS
2000.0000 mg | ORAL_TABLET | Freq: Once | ORAL | Status: AC
  Administered 2017-01-12: 2000 mg via ORAL
  Filled 2017-01-12: qty 4

## 2017-01-12 NOTE — ED Provider Notes (Signed)
[facility] Provider [facility]   CSN: [id number] Arrival date & time: 01/12/17  1719     History   Chief Complaint Chief Complaint  Patient presents with  . SEXUALLY TRANSMITTED DISEASE    HPI Gabriella Hill is a 21 y.o. female who presents with some intermittent pelvic pain and some lower urination. Patient reports she was called 2 days ago after her STD testing at the health department that she was positive for Chlamydia and Trichomonas. She has not had any vaginal discharge. She reports a sharp intermittent pelvic pain that only last for a few seconds and then goes away. She denies any other abdominal pain, nausea, vomiting, fevers, chest pain, shortness of breath. She is not on any birth control. LMP 12/09/2016. Her periods are irregular.  HPI  History reviewed. No pertinent past medical history.  There are no active problems to display for this patient.   Past Surgical History:  Procedure Laterality Date  . MOUTH SURGERY      OB History    No data available       Home Medications    Prior to Admission medications   Medication Sig Start Date End Date Taking? Authorizing Provider  cephALEXin (KEFLEX) 500 MG capsule Take 1 capsule (500 mg total) by mouth 2 (two) times daily. 01/12/17   Emi Holes, PA-C    Family History No family history on file.  Social History Social History  Substance Use Topics  . Smoking status: Never Smoker  . Smokeless tobacco: Never Used  . Alcohol use No     Allergies   Naproxen sodium   Review of Systems Review of Systems  Constitutional: Negative for chills and fever.  HENT: Negative for facial swelling and sore throat.   Respiratory: Negative for shortness of breath.   Cardiovascular: Negative for chest pain.  Gastrointestinal: Negative for abdominal pain, nausea and vomiting.  Genitourinary: Positive for difficulty urinating ("slower") and pelvic pain (intermittent). Negative for dysuria, vaginal  bleeding and vaginal discharge.  Musculoskeletal: Negative for back pain.  Skin: Negative for rash and wound.  Neurological: Negative for headaches.  Psychiatric/Behavioral: The patient is not nervous/anxious.      Physical Exam Updated Vital Signs BP 126/70 (BP Location: Right Arm)   Pulse (!) 58   Temp 99.1 F (37.3 C) (Oral)   Resp 16   Ht  (1.651 m) Comment: Simultaneous filing. User may not have seen previous data.  Wt 81.6 kg (180 lb) Comment: Simultaneous filing. User may not have seen previous data.  LMP 12/09/2016   SpO2 100%   BMI 29.95 kg/m   Physical Exam  Constitutional: She appears well-developed and well-nourished. No distress.  HENT:  Head: Normocephalic and atraumatic.  Mouth/Throat: Oropharynx is clear and moist. No oropharyngeal exudate.  Eyes: Pupils are equal, round, and reactive to light. Conjunctivae are normal. Right eye exhibits no discharge. Left eye exhibits no discharge. No scleral icterus.  Neck: Normal range of motion. Neck supple. No thyromegaly present.  Cardiovascular: Normal rate, regular rhythm, normal heart sounds and intact distal pulses.  Exam reveals no gallop and no friction rub.   No murmur heard. Pulmonary/Chest: Effort normal and breath sounds normal. No stridor. No respiratory distress. She has no wheezes. She has no rales.  Abdominal: Soft. Bowel sounds are normal. She exhibits no distension. There is no tenderness. There is no rebound and no guarding.  Genitourinary: Uterus normal. Pelvic exam was performed with patient supine. Cervix exhibits discharge. Cervix exhibits  no motion tenderness. Right adnexum displays no mass and no tenderness. Left adnexum displays no mass and no tenderness. Vaginal discharge found.  Genitourinary Comments: Chaperone present  Musculoskeletal: She exhibits no edema.  Lymphadenopathy:    She has no cervical adenopathy.  Neurological: She is alert. Coordination normal.  Skin: Skin is warm and dry. No  rash noted. She is not diaphoretic. No pallor.  Psychiatric: She has a normal mood and affect.  Nursing note and vitals reviewed.    ED Treatments / Results  Labs (all labs ordered are listed, but only abnormal results are displayed) Labs Reviewed  WET PREP, GENITAL - Abnormal; Notable for the following:       Result Value   Trich, Wet Prep PRESENT (*)    Clue Cells Wet Prep HPF POC PRESENT (*)    WBC, Wet Prep HPF POC MANY (*)    All other components within normal limits  URINALYSIS, ROUTINE W REFLEX MICROSCOPIC - Abnormal; Notable for the following:    Specific Gravity, Urine <1.005 (*)    Leukocytes, UA MODERATE (*)    All other components within normal limits  URINALYSIS, MICROSCOPIC (REFLEX) - Abnormal; Notable for the following:    Bacteria, UA MANY (*)    Squamous Epithelial / LPF 0-5 (*)    All other components within normal limits  URINE CULTURE  PREGNANCY, URINE  GC/CHLAMYDIA PROBE AMP ([place]) NOT AT [facility]    EKG  EKG Interpretation None       Radiology No results found.  Procedures Procedures (including critical care time)  Medications Ordered in ED Medications  azithromycin (ZITHROMAX) tablet 1,000 mg (1,000 mg Oral Given 01/12/17 1931)  metroNIDAZOLE (FLAGYL) tablet 2,000 mg (2,000 mg Oral Given 01/12/17 1932)  cephALEXin (KEFLEX) capsule 500 mg (500 mg Oral Given 01/12/17 1933)     Initial Impression / Assessment and Plan / ED Course  I have reviewed the triage vital signs and the nursing notes.  Pertinent labs & imaging results that were available during my care of the patient were reviewed by me and considered in my medical decision making (see chart for details).     Patient with known positive chlamydia and Trichomonas. Trichomonas confirmed today on wet prep. GC/chlamydia sent. Patient declined HIV and RPR. UA also shows moderate leukocytes, and many bacteria. Urine culture sent. Urine pregnancy negative. We'll treat with azithromycin,  Flagyl, Keflex for UTI. No signs of PID. Patient advised she will be called in 3 days of positive results. She is advised to have all of her sexual partners treated as well. Return precautions discussed. Patient understands and agrees with plan. Patient vitals stable throughout ED course and discharged in satisfactory condition.  Final Clinical Impressions(s) / ED Diagnoses   Final diagnoses:  STD exposure  Trichomonas infection    New Prescriptions Discharge Medication List as of 01/12/2017  7:32 PM    START taking these medications   Details  cephALEXin (KEFLEX) 500 MG capsule Take 1 capsule (500 mg total) by mouth 2 (two) times daily., Starting Fri 01/12/2017, Print         Tanisa Lagace, Waylan Boga, PA-C 01/12/17 2029    Pricilla Loveless, MD 01/12/17 2317

## 2017-01-12 NOTE — ED Triage Notes (Signed)
Pt states was called by [facility] 2 days ago and advised she was pos for STD-NAD-steady gait

## 2017-01-12 NOTE — Discharge Instructions (Signed)
You have been treated for chlamydia, and trichomonas today. Do not drink alcohol within 24 hours of today's visit, as it could interact with the medications were given make you feel very sick. You will be called in 3 days this days if any of your tests return positive. In that case, please make all of your sexual partners aware that they will need to be treated as well. Abstain from intercourse for one week until you have both been treated. Use condoms in the future to help prevent sexually transmitted disease and unwanted pregnancy. You can go to the health department in the future for free STD testing.

## 2017-01-15 LAB — GC/CHLAMYDIA PROBE AMP (~~LOC~~) NOT AT ARMC
Chlamydia: POSITIVE — AB
Neisseria Gonorrhea: NEGATIVE

## 2017-01-15 LAB — URINE CULTURE
Culture: >=100000 — AB
Special Requests: NORMAL

## 2017-01-16 ENCOUNTER — Telehealth: Payer: Self-pay | Admitting: Emergency Medicine

## 2017-01-16 NOTE — Telephone Encounter (Signed)
Post ED Visit - Positive Culture Follow-up  Culture report reviewed by antimicrobial stewardship pharmacist:   Enzo Bi, Pharm.D.  Celedonio Miyamoto, [address].D., BCPS AQ-ID  Garvin Fila, Pharm.D., BCPS  Georgina Pillion, Pharm.D., BCPS  [place], [address].D., BCPS, AAHIVP  Estella Husk, Pharm.D., BCPS, AAHIVP  Lysle Pearl, PharmD, BCPS  Casilda Carls, PharmD, BCPS  Pollyann Samples, PharmD, BCPS  Positive urine culture Treated with cephalexin, organism sensitive to the same and no further patient follow-up is required at this time.  Berle Mull 01/16/2017, 11:40 AM

## 2017-03-21 ENCOUNTER — Other Ambulatory Visit: Payer: Self-pay

## 2017-03-21 ENCOUNTER — Emergency Department (HOSPITAL_BASED_OUTPATIENT_CLINIC_OR_DEPARTMENT_OTHER)
Admission: EM | Admit: 2017-03-21 | Discharge: 2017-03-21 | Disposition: A | Discharge status: Home / Self Care | Payer: Self-pay | Attending: Emergency Medicine | Admitting: Emergency Medicine

## 2017-03-21 ENCOUNTER — Emergency Department (HOSPITAL_BASED_OUTPATIENT_CLINIC_OR_DEPARTMENT_OTHER): Payer: Self-pay

## 2017-03-21 ENCOUNTER — Encounter (HOSPITAL_BASED_OUTPATIENT_CLINIC_OR_DEPARTMENT_OTHER): Payer: Self-pay | Admitting: Emergency Medicine

## 2017-03-21 DIAGNOSIS — R110 Nausea: Secondary | ICD-10-CM | POA: Insufficient documentation

## 2017-03-21 DIAGNOSIS — R05 Cough: Secondary | ICD-10-CM | POA: Insufficient documentation

## 2017-03-21 DIAGNOSIS — R69 Illness, unspecified: Secondary | ICD-10-CM

## 2017-03-21 DIAGNOSIS — J111 Influenza due to unidentified influenza virus with other respiratory manifestations: Secondary | ICD-10-CM

## 2017-03-21 DIAGNOSIS — R509 Fever, unspecified: Secondary | ICD-10-CM | POA: Insufficient documentation

## 2017-03-21 DIAGNOSIS — J029 Acute pharyngitis, unspecified: Secondary | ICD-10-CM | POA: Insufficient documentation

## 2017-03-21 MED ORDER — ACETAMINOPHEN 500 MG PO TABS
1000.0000 mg | ORAL_TABLET | Freq: Once | ORAL | Status: AC
  Administered 2017-03-21: 1000 mg via ORAL
  Filled 2017-03-21: qty 2

## 2017-03-21 MED ORDER — ONDANSETRON 4 MG PO TBDP
4.0000 mg | ORAL_TABLET | Freq: Once | ORAL | Status: AC
  Administered 2017-03-21: 4 mg via ORAL
  Filled 2017-03-21: qty 1

## 2017-03-21 MED ORDER — ONDANSETRON 4 MG PO TBDP: 4.0000 mg | ORAL_TABLET | Freq: Three times a day (TID) | ORAL | 0 refills | Status: DC | PRN

## 2017-03-21 MED ORDER — BENZONATATE 100 MG PO CAPS: 100.0000 mg | ORAL_CAPSULE | Freq: Three times a day (TID) | ORAL | 0 refills | Status: DC

## 2017-03-21 MED FILL — BENZONATATE 100 MG CAPSULE: 100 | 5 days supply | Qty: 15 | Fill #0

## 2017-03-21 MED FILL — ONDANSETRON ODT 4 MG TABLET: 4 | 3 days supply | Qty: 10 | Fill #0

## 2017-03-21 NOTE — ED Triage Notes (Signed)
Flu like symptoms since Saturday.  Nasal congestion, runny nose, sore throat, ear pain, body aches, nausea, dry cough.

## 2017-03-21 NOTE — ED Notes (Signed)
NAD at this time. Pt is stable and going home.  

## 2017-03-21 NOTE — ED Provider Notes (Signed)
[facility] EMERGENCY DEPARTMENT Provider Note   CSN: [id number] Arrival date & time: 03/21/17  0941     History   Chief Complaint Chief Complaint  Patient presents with  . Influenza    HPI Gabriella Hill is a 21 y.o. female.  The history is provided by the patient and medical records. No language interpreter was used.   Gabriella Hill is a 21 y.o. female  with no pertinent PMH who presents to the Emergency Department complaining of persistent, constant generalized body aches associated with fever, productive cough, congestion, sore throat times 4-5 days.  Denies alleviating or aggravating factors.  No medications taken prior to arrival for her symptoms.  Little cousin that she sees multiple times a week has similar symptoms.  Did not receive flu shot this year.  Not a smoker.  No past medical history on file.  There are no active problems to display for this patient.   Past Surgical History:  Procedure Laterality Date  . MOUTH SURGERY      OB History    No data available       Home Medications    Prior to Admission medications   Medication Sig Start Date End Date Taking? Authorizing Provider  benzonatate (TESSALON) 100 MG capsule Take 1 capsule (100 mg total) by mouth every 8 (eight) hours. 03/21/17   Ward, Chase PicketJaime Pilcher, PA-C  ondansetron (ZOFRAN ODT) 4 MG disintegrating tablet Take 1 tablet (4 mg total) by mouth every 8 (eight) hours as needed for nausea or vomiting. 03/21/17   Ward, Chase PicketJaime Pilcher, PA-C    Family History No family history on file.  Social History Social History   Tobacco Use  . Smoking status: Never Smoker  . Smokeless tobacco: Never Used  Substance Use Topics  . Alcohol use: No  . Drug use: No     Allergies   Naproxen sodium   Review of Systems Review of Systems  Constitutional: Positive for chills and fever.  HENT: Positive for congestion and sore throat. Negative for ear discharge, ear pain and trouble  swallowing.   Eyes: Negative for visual disturbance.  Respiratory: Positive for cough. Negative for shortness of breath and wheezing.   Cardiovascular: Negative for chest pain.  Gastrointestinal: Positive for nausea. Negative for abdominal pain, blood in stool, constipation, diarrhea and vomiting.  Musculoskeletal: Positive for myalgias.  Skin: Negative for rash.     Physical Exam Updated Vital Signs BP 124/79 (BP Location: Right Arm)   Pulse 83   Temp (!) 101.8 F (38.8 C) (Oral)   Resp 18   LMP 03/19/2017   SpO2 100%   Physical Exam  Constitutional: She is oriented to person, place, and time. She appears well-developed and well-nourished. No distress.  HENT:  Head: Normocephalic and atraumatic.  OP with erythema, no exudates or tonsillar hypertrophy. + nasal congestion with mucosal edema.   Neck: Normal range of motion. Neck supple.  No meningeal signs.   Cardiovascular: Normal rate, regular rhythm and normal heart sounds.  Pulmonary/Chest: Effort normal.  Lungs are clear to auscultation bilaterally - no w/r/r  Abdominal: Soft. She exhibits no distension. There is no tenderness.  Musculoskeletal: Normal range of motion.  Neurological: She is alert and oriented to person, place, and time.  Skin: Skin is warm and dry. She is not diaphoretic.  Nursing note and vitals reviewed.    ED Treatments / Results  Labs (all labs ordered are listed, but only abnormal results are displayed) Labs Reviewed - No  data to display  EKG  EKG Interpretation None       Radiology Dg Chest 2 View  Result Date: 03/21/2017 CLINICAL DATA:  Flu like symptoms with fever and cough since 03/17/2017. EXAM: CHEST  2 VIEW COMPARISON:  PA and lateral chest 03/08/2015 and 05/11/2011. FINDINGS: The lungs are clear. Heart size is normal. No pneumothorax or pleural fluid. No bony abnormality. IMPRESSION: Negative chest. Electronically Signed   By: Drusilla Kannerhomas  Dalessio M.D.   On: 03/21/2017 10:14     Procedures Procedures (including critical care time)  Medications Ordered in ED Medications  acetaminophen (TYLENOL) tablet 1,000 mg (1,000 mg Oral Given 03/21/17 1008)  ondansetron (ZOFRAN-ODT) disintegrating tablet 4 mg (4 mg Oral Given 03/21/17 1008)     Initial Impression / Assessment and Plan / ED Course  I have reviewed the triage vital signs and the nursing notes.  Pertinent labs & imaging results that were available during my care of the patient were reviewed by me and considered in my medical decision making (see chart for details).    Gabriella Hill is a 21 y.o. female who presents to ED for influenza-like-illness x 4 days. On exam, patient febrile, 101.8, hemodynamically stable with a clear lung exam. No signs of dehydration and tolerating PO well in ED today. CXR negative.  The patient understands that symptoms are greater than the recommended 48 hour window of treatment.   Patient discharged home with instructions to orally hydrate, rest, and use over-the-counter medications such as Tylenol / body aches for fever. She is allergic to NSAID's. Rx for tessalon and zofran given.   PCP follow up strongly encouraged. Reasons to return to ER discussed. All questions answered.   Final Clinical Impressions(s) / ED Diagnoses   Final diagnoses:  Fever  Influenza-like illness    ED Discharge Orders        Ordered    ondansetron (ZOFRAN ODT) 4 MG disintegrating tablet  Every 8 hours PRN     03/21/17 1033    benzonatate (TESSALON) 100 MG capsule  Every 8 hours     03/21/17 1033       Ward, Chase PicketJaime Pilcher, PA-C 03/21/17 1040    Loren RacerYelverton, David, MD 03/21/17 [phone]

## 2017-03-21 NOTE — Discharge Instructions (Signed)
It was my pleasure taking care of you today!  Zofran as needed for nausea. Tessalon as needed for cough. Tylenol as needed for body aches / fevers. Rest, drink plenty of fluids to stay hydrated. Wash your hands often.  Follow up with your doctor in regards to your hospital visit. Return to the emergency department if symptoms worsen, become progressive, or become more concerning.  GET HELP RIGHT AWAY IF:  You have shortness of breath while resting.  You have pain or pressure in the chest You suddenly feel dizzy.  You feel confused.  You have a hard time breathing.  You get a bad neck pain or stiffness. You throw up (vomit) a lot and often.  You have a fever > 101 that persists despite Tylenol  MAKE SURE YOU:  Understand these instructions.  Will watch your condition.  Will get help right away if you are not doing well or get worse.

## 2019-05-31 ENCOUNTER — Other Ambulatory Visit: Payer: Self-pay

## 2019-05-31 ENCOUNTER — Emergency Department (HOSPITAL_BASED_OUTPATIENT_CLINIC_OR_DEPARTMENT_OTHER): Payer: Self-pay

## 2019-05-31 ENCOUNTER — Encounter (HOSPITAL_BASED_OUTPATIENT_CLINIC_OR_DEPARTMENT_OTHER): Payer: Self-pay | Admitting: Emergency Medicine

## 2019-05-31 ENCOUNTER — Emergency Department (HOSPITAL_BASED_OUTPATIENT_CLINIC_OR_DEPARTMENT_OTHER)
Admission: EM | Admit: 2019-05-31 | Discharge: 2019-05-31 | Disposition: A | Discharge status: Home / Self Care | Payer: Self-pay | Attending: Emergency Medicine | Admitting: Emergency Medicine

## 2019-05-31 DIAGNOSIS — N739 Female pelvic inflammatory disease, unspecified: Secondary | ICD-10-CM | POA: Insufficient documentation

## 2019-05-31 DIAGNOSIS — N730 Acute parametritis and pelvic cellulitis: Secondary | ICD-10-CM

## 2019-05-31 DIAGNOSIS — R102 Pelvic and perineal pain: Secondary | ICD-10-CM

## 2019-05-31 DIAGNOSIS — Z79899 Other long term (current) drug therapy: Secondary | ICD-10-CM | POA: Insufficient documentation

## 2019-05-31 HISTORY — DX: Trichomoniasis, unspecified: A59.9

## 2019-05-31 HISTORY — DX: Chlamydial infection, unspecified: A74.9

## 2019-05-31 LAB — CBC WITH DIFFERENTIAL/PLATELET
Abs Immature Granulocytes: 0.25 10*3/uL — ABNORMAL HIGH (ref 0.00–0.07)
Basophils Absolute: 0.1 10*3/uL (ref 0.0–0.1)
Basophils Relative: 0 %
Eosinophils Absolute: 0 10*3/uL (ref 0.0–0.5)
Eosinophils Relative: 0 %
HCT: 30.9 % — ABNORMAL LOW (ref 36.0–46.0)
Hemoglobin: 10.3 g/dL — ABNORMAL LOW (ref 12.0–15.0)
Immature Granulocytes: 1 %
Lymphocytes Relative: 7 %
Lymphs Abs: 1.5 10*3/uL (ref 0.7–4.0)
MCH: 30.8 pg (ref 26.0–34.0)
MCHC: 33.3 g/dL (ref 30.0–36.0)
MCV: 92.5 fL (ref 80.0–100.0)
Monocytes Absolute: 1.1 10*3/uL — ABNORMAL HIGH (ref 0.1–1.0)
Monocytes Relative: 5 %
Neutro Abs: 18 10*3/uL — ABNORMAL HIGH (ref 1.7–7.7)
Neutrophils Relative %: 87 %
Platelets: 352 10*3/uL (ref 150–400)
RBC: 3.34 MIL/uL — ABNORMAL LOW (ref 3.87–5.11)
RDW: 13.3 % (ref 11.5–15.5)
WBC: 20.8 10*3/uL — ABNORMAL HIGH (ref 4.0–10.5)
nRBC: 0 % (ref 0.0–0.2)

## 2019-05-31 LAB — COMPREHENSIVE METABOLIC PANEL
ALT: 18 U/L (ref 0–44)
AST: 16 U/L (ref 15–41)
Albumin: 3.7 g/dL (ref 3.5–5.0)
Alkaline Phosphatase: 57 U/L (ref 38–126)
Anion gap: 7 (ref 5–15)
BUN: 11 mg/dL (ref 6–20)
CO2: 26 mmol/L (ref 22–32)
Calcium: 9.1 mg/dL (ref 8.9–10.3)
Chloride: 102 mmol/L (ref 98–111)
Creatinine, Ser: 0.7 mg/dL (ref 0.44–1.00)
GFR calc Af Amer: >60 mL/min (ref >60)
GFR calc non Af Amer: >60 mL/min (ref >60)
Glucose, Bld: 93 mg/dL (ref 70–99)
Potassium: 3.5 mmol/L (ref 3.5–5.1)
Sodium: 135 mmol/L (ref 135–145)
Total Bilirubin: 0.4 mg/dL (ref 0.3–1.2)
Total Protein: 7 g/dL (ref 6.5–8.1)

## 2019-05-31 LAB — WET PREP, GENITAL
Sperm: NONE SEEN
Trich, Wet Prep: NONE SEEN
Yeast Wet Prep HPF POC: NONE SEEN

## 2019-05-31 LAB — PREGNANCY, URINE: Preg Test, Ur: NEGATIVE

## 2019-05-31 LAB — URINALYSIS, ROUTINE W REFLEX MICROSCOPIC
Bilirubin Urine: NEGATIVE
Glucose, UA: NEGATIVE mg/dL
Hgb urine dipstick: NEGATIVE
Ketones, ur: >80 mg/dL — AB
Nitrite: POSITIVE — AB
Protein, ur: NEGATIVE mg/dL
Specific Gravity, Urine: 1.02 (ref 1.005–1.030)
pH: 6.5 (ref 5.0–8.0)

## 2019-05-31 LAB — URINALYSIS, MICROSCOPIC (REFLEX): RBC / HPF: NONE SEEN RBC/hpf (ref 0–5)

## 2019-05-31 LAB — LIPASE, BLOOD: Lipase: 16 U/L (ref 11–51)

## 2019-05-31 MED ORDER — FENTANYL CITRATE (PF) 100 MCG/2ML IJ SOLN
100.0000 ug | Freq: Once | INTRAMUSCULAR | Status: AC
  Administered 2019-05-31: 06:00:00 100 ug via INTRAVENOUS
  Filled 2019-05-31: qty 2

## 2019-05-31 MED ORDER — ONDANSETRON HCL 4 MG/2ML IJ SOLN
4.0000 mg | Freq: Once | INTRAMUSCULAR | Status: AC
  Administered 2019-05-31: 4 mg via INTRAVENOUS
  Filled 2019-05-31: qty 2

## 2019-05-31 MED ORDER — MORPHINE SULFATE (PF) 4 MG/ML IV SOLN
4.0000 mg | Freq: Once | INTRAVENOUS | Status: AC
  Administered 2019-05-31: 4 mg via INTRAVENOUS
  Filled 2019-05-31: qty 1

## 2019-05-31 MED ORDER — HYDROCODONE-ACETAMINOPHEN 5-325 MG PO TABS
1.0000 | ORAL_TABLET | ORAL | Status: AC
  Administered 2019-05-31: 1 via ORAL
  Filled 2019-05-31: qty 1

## 2019-05-31 MED ORDER — IOHEXOL 300 MG/ML  SOLN
100.0000 mL | Freq: Once | INTRAMUSCULAR | Status: AC | PRN
  Administered 2019-05-31: 100 mL via INTRAVENOUS

## 2019-05-31 MED ORDER — ONDANSETRON 8 MG PO TBDP: 8.0000 mg | ORAL_TABLET | Freq: Three times a day (TID) | ORAL | 0 refills | Status: DC | PRN

## 2019-05-31 MED ORDER — DOXYCYCLINE HYCLATE 100 MG PO TABS: 100.0000 mg | ORAL_TABLET | Freq: Two times a day (BID) | ORAL | 0 refills | Status: DC

## 2019-05-31 MED ORDER — SODIUM CHLORIDE 0.9 % IV SOLN
1.0000 g | Freq: Once | INTRAVENOUS | Status: AC
  Administered 2019-05-31: 1 g via INTRAVENOUS
  Filled 2019-05-31: qty 10

## 2019-05-31 MED ORDER — HYDROCODONE-ACETAMINOPHEN 5-325 MG PO TABS: 1.0000 | ORAL_TABLET | Freq: Four times a day (QID) | ORAL | 0 refills | Status: DC | PRN

## 2019-05-31 NOTE — ED Notes (Signed)
ED Provider at bedside. 

## 2019-05-31 NOTE — ED Triage Notes (Signed)
Pt is c/o right flank pain that started 2 days ago  Pt states the pain came on suddenly and is sharp in nature

## 2019-05-31 NOTE — Discharge Instructions (Signed)
Take the antibiotics until they are finished.  Take the pain medication and antinausea medication as needed.  Follow-up with an OB/GYN doctor to make sure you are improving.  Return to the emergency department if the symptoms are worsening or you are not able to keep down your occasions

## 2019-05-31 NOTE — ED Provider Notes (Signed)
Pt seen by Dr Read Drivers. Workup concerning for PID.  CT without findings concerning for appendicitis. [place] without signs of TOA.  Pt without vomiting, fever.  Appears stable for outpatient treatment of PID.  Discussed outpt follow up, and precautions to return to the ED   Linwood Dibbles, MD 05/31/19 1003

## 2019-05-31 NOTE — ED Provider Notes (Signed)
[facility]-EMERGENCY DEPT Provider Note: Lowella Dell, MD, FACEP  CSN: [id number] MRN: [id number] ARRIVAL: 05/31/19 at 0500 ROOM: MH06/MH06   CHIEF COMPLAINT  Flank Pain   HISTORY OF PRESENT ILLNESS  05/31/19 5:17 AM Gabriella Hill is a 24 y.o. female with 2 days of right flank pain radiating to her right lower quadrant.  Pain is gradually worsened and it is now a 10 out of 10.  She describes the pain as sharp.  It is worse with movement or palpation.  She has had nausea and chills but no fever, no vomiting, dysuria, hematuria, vaginal bleeding or vaginal discharge.  Her last bowel movement was yesterday morning.  Her last menstrual period was 05/24/2019.   Past Medical History:  Diagnosis Date  . Chlamydia   . Trichomoniasis     Past Surgical History:  Procedure Laterality Date  . MOUTH SURGERY      History reviewed. No pertinent family history.  Social History   Tobacco Use  . Smoking status: Never Smoker  . Smokeless tobacco: Never Used  Substance Use Topics  . Alcohol use: Yes    Comment: occ  . Drug use: Yes    Types: Marijuana    Prior to Admission medications   Medication Sig Start Date End Date Taking? Authorizing Provider  benzonatate (TESSALON) 100 MG capsule Take 1 capsule (100 mg total) by mouth every 8 (eight) hours. 03/21/17   Ward, Chase Picket, PA-C  doxycycline (VIBRA-TABS) 100 MG tablet Take 1 tablet (100 mg total) by mouth 2 (two) times daily. 05/31/19   Linwood Dibbles, MD  HYDROcodone-acetaminophen (NORCO/VICODIN) 5-325 MG tablet Take 1 tablet by mouth every 6 (six) hours as needed. 05/31/19   Linwood Dibbles, MD  ondansetron (ZOFRAN ODT) 8 MG disintegrating tablet Take 1 tablet (8 mg total) by mouth every 8 (eight) hours as needed for nausea or vomiting. 05/31/19   Linwood Dibbles, MD    Allergies Naproxen sodium   REVIEW OF SYSTEMS  Negative except as noted here or in the History of Present Illness.   PHYSICAL EXAMINATION  Initial Vital Signs Blood  pressure 116/73, pulse 86, temperature 98.4 F (36.9 C), temperature source Oral, resp. rate 18, height 5\' 5"  (1.651 m), weight 78.5 kg, last menstrual period 05/24/2019, SpO2 99 %.  Examination General: Well-developed, well-nourished female in no acute distress; appearance consistent with age of record HENT: normocephalic; atraumatic Eyes: pupils equal, round and reactive to light; extraocular muscles intact Neck: supple Heart: regular rate and rhythm Lungs: clear to auscultation bilaterally Abdomen: soft; nondistended; diffuse tenderness most prominent in the right upper quadrant; no masses or hepatosplenomegaly; bowel sounds present GU: Mild right CVA tenderness; yellow, frothy, abnormal-smelling yellow-green vaginal discharge; no vaginal bleeding; moderate cervical motion tenderness; moderate bilateral adnexal tenderness Extremities: No deformity; full range of motion; pulses normal Neurologic: Awake, alert and oriented; motor function intact in all extremities and symmetric; no facial droop Skin: Warm and dry Psychiatric: Flat affect   RESULTS  Summary of this visit's results, reviewed and interpreted by myself:   EKG Interpretation  Date/Time:    Ventricular Rate:    PR Interval:    QRS Duration:   QT Interval:    QTC Calculation:   R Axis:     Text Interpretation:        Laboratory Studies: Results for orders placed or performed during the hospital encounter of 05/31/19 (from the past 24 hour(s))  Urinalysis, Routine w reflex microscopic     Status: Abnormal  Collection Time: 05/31/19  5:13 AM  Result Value Ref Range   Color, Urine YELLOW YELLOW   APPearance CLOUDY (A) CLEAR   Specific Gravity, Urine 1.020 1.005 - 1.030   pH 6.5 5.0 - 8.0   Glucose, UA NEGATIVE NEGATIVE mg/dL   Hgb urine dipstick NEGATIVE NEGATIVE   Bilirubin Urine NEGATIVE NEGATIVE   Ketones, ur >80 (A) NEGATIVE mg/dL   Protein, ur NEGATIVE NEGATIVE mg/dL   Nitrite POSITIVE (A) NEGATIVE    Leukocytes,Ua SMALL (A) NEGATIVE  Pregnancy, urine     Status: None   Collection Time: 05/31/19  5:13 AM  Result Value Ref Range   Preg Test, Ur NEGATIVE NEGATIVE  Urinalysis, Microscopic (reflex)     Status: Abnormal   Collection Time: 05/31/19  5:13 AM  Result Value Ref Range   RBC / HPF NONE SEEN 0 - 5 RBC/hpf   WBC, UA 11-20 0 - 5 WBC/hpf   Bacteria, UA MANY (A) NONE SEEN   Squamous Epithelial / LPF 0-5 0 - 5  Wet prep, genital     Status: Abnormal   Collection Time: 05/31/19  5:36 AM  Result Value Ref Range   Yeast Wet Prep HPF POC NONE SEEN NONE SEEN   Trich, Wet Prep NONE SEEN NONE SEEN   Clue Cells Wet Prep HPF POC PRESENT (A) NONE SEEN   WBC, Wet Prep HPF POC MANY (A) NONE SEEN   Sperm NONE SEEN   CBC with Differential/Platelet     Status: Abnormal   Collection Time: 05/31/19  5:45 AM  Result Value Ref Range   WBC 20.8 (H) 4.0 - 10.5 K/uL   RBC 3.34 (L) 3.87 - 5.11 MIL/uL   Hemoglobin 10.3 (L) 12.0 - 15.0 g/dL   HCT 01.0 (L) 27.2 - 53.6 %   MCV 92.5 80.0 - 100.0 fL   MCH 30.8 26.0 - 34.0 pg   MCHC 33.3 30.0 - 36.0 g/dL   RDW 64.4 03.4 - 74.2 %   Platelets 352 150 - 400 K/uL   nRBC 0.0 0.0 - 0.2 %   Neutrophils Relative % 87 %   Neutro Abs 18.0 (H) 1.7 - 7.7 K/uL   Lymphocytes Relative 7 %   Lymphs Abs 1.5 0.7 - 4.0 K/uL   Monocytes Relative 5 %   Monocytes Absolute 1.1 (H) 0.1 - 1.0 K/uL   Eosinophils Relative 0 %   Eosinophils Absolute 0.0 0.0 - 0.5 K/uL   Basophils Relative 0 %   Basophils Absolute 0.1 0.0 - 0.1 K/uL   Immature Granulocytes 1 %   Abs Immature Granulocytes 0.25 (H) 0.00 - 0.07 K/uL  Comprehensive metabolic panel     Status: None   Collection Time: 05/31/19  5:45 AM  Result Value Ref Range   Sodium 135 135 - 145 mmol/L   Potassium 3.5 3.5 - 5.1 mmol/L   Chloride 102 98 - 111 mmol/L   CO2 26 22 - 32 mmol/L   Glucose, Bld 93 70 - 99 mg/dL   BUN 11 6 - 20 mg/dL   Creatinine, Ser 5.95 0.44 - 1.00 mg/dL   Calcium 9.1 8.9 - 63.8 mg/dL    Total Protein 7.0 6.5 - 8.1 g/dL   Albumin 3.7 3.5 - 5.0 g/dL   AST 16 15 - 41 U/L   ALT 18 0 - 44 U/L   Alkaline Phosphatase 57 38 - 126 U/L   Total Bilirubin 0.4 0.3 - 1.2 mg/dL   GFR calc non Af Amer >60 >  60 mL/min   GFR calc Af Amer >60 >60 mL/min   Anion gap 7 5 - 15  Lipase, blood     Status: None   Collection Time: 05/31/19  5:45 AM  Result Value Ref Range   Lipase 16 11 - 51 U/L   Imaging Studies: CT ABDOMEN PELVIS W CONTRAST  Result Date: 05/31/2019 CLINICAL DATA:  Pelvic and right lower quadrant pain. Vaginal discharge with pyuria. Leukocytosis. EXAM: CT ABDOMEN AND PELVIS WITH CONTRAST TECHNIQUE: Multidetector CT imaging of the abdomen and pelvis was performed using the standard protocol following bolus administration of intravenous contrast. CONTRAST:  OMNIPAQUE IOHEXOL 300 MG/ML  SOLN COMPARISON:  CT abdomen pelvis 02/15/2018 FINDINGS: Lower chest: Lung bases clear. Hepatobiliary: No focal liver abnormality is seen. No gallstones, gallbladder wall thickening, or biliary dilatation. Pancreas: Negative Spleen: Negative Adrenals/Urinary Tract: Adrenal glands are unremarkable. Kidneys are normal, without renal calculi, focal lesion, or hydronephrosis. Bladder is unremarkable. Stomach/Bowel: Negative for bowel obstruction. No bowel mass or edema. Appendix not visualized. Vascular/Lymphatic: Negative Reproductive: 2.3 cm right adnexal cyst with ill-defined borders. 2 cm left adnexal cyst. No free fluid. Normal uterus. Other: None Musculoskeletal: Negative IMPRESSION: Appendix not visualized.  No evidence of acute appendicitis Bilateral adnexal cysts which may be functional cyst. Given history, right tubo-ovarian abscess not excluded. Electronically Signed   By: Marlan Palau M.D.   On: 05/31/2019 06:56   US PELVIC COMPLETE WITH TRANSVAGINAL  Result Date: 05/31/2019 CLINICAL DATA:  24 year old female with pelvic pain and elevated white count. Negative pregnancy test. EXAM:  TRANSABDOMINAL AND TRANSVAGINAL ULTRASOUND OF PELVIS TECHNIQUE: Both transabdominal and transvaginal ultrasound examinations of the pelvis were performed. Transabdominal technique was performed for global imaging of the pelvis including uterus, ovaries, adnexal regions, and pelvic cul-de-sac. It was necessary to proceed with endovaginal exam following the transabdominal exam to visualize the ovaries and endometrium. COMPARISON:  05/31/2019 CT FINDINGS: Uterus Measurements: 7 x 3.1 x 4.5 cm = volume: 51 mL. No fibroids or other mass visualized. Endometrium Thickness: 1.2 mm.  No focal abnormality visualized. Right ovary Measurements: 3.6 x 2.6 x 2.8 cm = volume: 14 mL. Normal appearance/no adnexal mass. Left ovary Measurements: 3.4 x 2.2 x 2 cm = volume: 8 mL. Normal appearance/no adnexal mass. Other findings A trace amount of free pelvic fluid is noted. There is no evidence of abscess. IMPRESSION: 1. Trace amount of free pelvic fluid, otherwise unremarkable pelvic ultrasound. No evidence of pelvic abscess. Electronically Signed   By: Harmon Pier M.D.   On: 05/31/2019 09:47    ED COURSE and MDM  Nursing notes, initial and subsequent vitals signs, including pulse oximetry, reviewed and interpreted by myself.  Vitals:   05/31/19 0512 05/31/19 0515 05/31/19 1041  BP:  116/73 105/61  Pulse:  86 63  Resp:  18 16  Temp:  98.4 F (36.9 C)   TempSrc:  Oral   SpO2:  99% 100%  Weight: 78.5 kg    Height: 5\' 5"  (1.651 m)     Medications  fentaNYL (SUBLIMAZE) injection 100 mcg (100 mcg Intravenous Given 05/31/19 0547)  ondansetron (ZOFRAN) injection 4 mg (4 mg Intravenous Given 05/31/19 0547)  iohexol (OMNIPAQUE) 300 MG/ML solution 100 mL (100 mLs Intravenous Contrast Given 05/31/19 0641)  morphine 4 MG/ML injection 4 mg (4 mg Intravenous Given 05/31/19 0922)  cefTRIAXone (ROCEPHIN) 1 g in sodium chloride 0.9 % 100 mL IVPB (0 g Intravenous Stopped 05/31/19 1037)  HYDROcodone-acetaminophen (NORCO/VICODIN) 5-325  MG per tablet 1 tablet (  1 tablet Oral Given 05/31/19 1036)   6:56 AM CT of the abdomen and pelvis pending. Signed out to Dr. Tomi Bamberger.   PROCEDURES  Procedures   ED DIAGNOSES     ICD-10-CM   1. PID (acute pelvic inflammatory disease)  N73.0   2. Pelvic pain in female  R10.2 US PELVIC COMPLETE WITH TRANSVAGINAL    US PELVIC COMPLETE WITH TRANSVAGINAL       Letetia Romanello, MD 05/31/19 [phone]

## 2019-06-02 LAB — URINE CULTURE: Culture: >=100000 — AB

## 2019-06-03 ENCOUNTER — Telehealth: Payer: Self-pay | Admitting: *Deleted

## 2019-06-03 LAB — GC/CHLAMYDIA PROBE AMP (~~LOC~~) NOT AT ARMC
Chlamydia: NEGATIVE
Neisseria Gonorrhea: POSITIVE — AB

## 2019-06-03 NOTE — Telephone Encounter (Signed)
Post ED Visit - Positive Culture Follow-up  Culture report reviewed by antimicrobial stewardship pharmacist: [facility] []  , Pharm.D. []  Enzo Bi, Pharm.D., BCPS AQ-ID []  , Pharm.D., BCPS []  Celedonio Miyamoto, Pharm.D., BCPS []  [place], Garvin Fila.D., BCPS, AAHIVP []  , Pharm.D., BCPS, AAHIVP []  Georgina Pillion, PharmD, BCPS []  , PharmD, BCPS []  [place], PharmD, BCPS []  [place], PharmD []  , PharmD, BCPS []  Estella Husk, PharmD  Pharmacy Team []  Lysle Pearl, PharmD []  , PharmD []  Phillips Climes, PharmD []  , Rph []  Agapito Games) , PharmD []  Verlan Friends, PharmD []  , PharmD []  Mervyn Gay, PharmD []  , PharmD []  Vinnie Level, PharmD []  [facility], PharmD []  , PharmD []  Len Childs, PharmD   Positive urine culture, reviewed by , PA Treated with Doxycycline, organism sensitive to the same and no further patient follow-up is required at this time.  Greer Pickerel [facility] 06/03/2019, 9:59 AM

## 2019-06-09 ENCOUNTER — Telehealth (HOSPITAL_COMMUNITY): Payer: Self-pay

## 2021-04-07 ENCOUNTER — Encounter: Payer: Self-pay | Admitting: Family Medicine

## 2021-04-07 NOTE — Progress Notes (Signed)
Patient did not keep appointment today. She may call to reschedule.  

## 2021-10-02 ENCOUNTER — Encounter (HOSPITAL_BASED_OUTPATIENT_CLINIC_OR_DEPARTMENT_OTHER): Payer: Self-pay | Admitting: Emergency Medicine

## 2021-10-02 ENCOUNTER — Other Ambulatory Visit: Payer: Self-pay

## 2021-10-02 ENCOUNTER — Emergency Department (HOSPITAL_BASED_OUTPATIENT_CLINIC_OR_DEPARTMENT_OTHER)
Admission: EM | Admit: 2021-10-02 | Discharge: 2021-10-02 | Disposition: A | Discharge status: Home / Self Care | Payer: Self-pay | Attending: Emergency Medicine | Admitting: Emergency Medicine

## 2021-10-02 DIAGNOSIS — R197 Diarrhea, unspecified: Secondary | ICD-10-CM | POA: Insufficient documentation

## 2021-10-02 DIAGNOSIS — Z859 Personal history of malignant neoplasm, unspecified: Secondary | ICD-10-CM | POA: Insufficient documentation

## 2021-10-02 DIAGNOSIS — N3000 Acute cystitis without hematuria: Secondary | ICD-10-CM | POA: Insufficient documentation

## 2021-10-02 DIAGNOSIS — R112 Nausea with vomiting, unspecified: Secondary | ICD-10-CM | POA: Insufficient documentation

## 2021-10-02 LAB — COMPREHENSIVE METABOLIC PANEL
ALT: 12 U/L (ref 0–44)
AST: 19 U/L (ref 15–41)
Albumin: 4.5 g/dL (ref 3.5–5.0)
Alkaline Phosphatase: 51 U/L (ref 38–126)
Anion gap: 10 (ref 5–15)
BUN: 12 mg/dL (ref 6–20)
CO2: 26 mmol/L (ref 22–32)
Calcium: 10.1 mg/dL (ref 8.9–10.3)
Chloride: 104 mmol/L (ref 98–111)
Creatinine, Ser: 0.85 mg/dL (ref 0.44–1.00)
GFR, Estimated: >60 mL/min (ref >60)
Glucose, Bld: 97 mg/dL (ref 70–99)
Potassium: 3.9 mmol/L (ref 3.5–5.1)
Sodium: 140 mmol/L (ref 135–145)
Total Bilirubin: 0.3 mg/dL (ref 0.3–1.2)
Total Protein: 8.1 g/dL (ref 6.5–8.1)

## 2021-10-02 LAB — CBC
HCT: 35.7 % — ABNORMAL LOW (ref 36.0–46.0)
Hemoglobin: 11.8 g/dL — ABNORMAL LOW (ref 12.0–15.0)
MCH: 29.6 pg (ref 26.0–34.0)
MCHC: 33.1 g/dL (ref 30.0–36.0)
MCV: 89.7 fL (ref 80.0–100.0)
Platelets: 402 10*3/uL — ABNORMAL HIGH (ref 150–400)
RBC: 3.98 MIL/uL (ref 3.87–5.11)
RDW: 13.2 % (ref 11.5–15.5)
WBC: 7.4 10*3/uL (ref 4.0–10.5)
nRBC: 0 % (ref 0.0–0.2)

## 2021-10-02 LAB — LIPASE, BLOOD: Lipase: 13 U/L (ref 11–51)

## 2021-10-02 LAB — URINALYSIS, ROUTINE W REFLEX MICROSCOPIC
Bilirubin Urine: NEGATIVE
Glucose, UA: NEGATIVE mg/dL
Hgb urine dipstick: NEGATIVE
Ketones, ur: NEGATIVE mg/dL
Nitrite: POSITIVE — AB
Protein, ur: 30 mg/dL — AB
Specific Gravity, Urine: 1.028 (ref 1.005–1.030)
pH: 8 (ref 5.0–8.0)

## 2021-10-02 MED ORDER — SODIUM CHLORIDE 0.9 % IV BOLUS
1000.0000 mL | Freq: Once | INTRAVENOUS | Status: AC
  Administered 2021-10-02: 1000 mL via INTRAVENOUS

## 2021-10-02 MED ORDER — CEFADROXIL 500 MG PO CAPS: 500.0000 mg | ORAL_CAPSULE | Freq: Two times a day (BID) | ORAL | 0 refills | Status: DC

## 2021-10-02 MED ORDER — MORPHINE SULFATE (PF) 2 MG/ML IV SOLN
2.0000 mg | Freq: Once | INTRAVENOUS | Status: AC
  Administered 2021-10-02: 2 mg via INTRAVENOUS
  Filled 2021-10-02: qty 1

## 2021-10-02 MED ORDER — ACETAMINOPHEN 325 MG PO TABS
650.0000 mg | ORAL_TABLET | Freq: Once | ORAL | Status: DC
  Filled 2021-10-02: qty 2

## 2021-10-02 MED ORDER — ONDANSETRON HCL 4 MG/2ML IJ SOLN
INTRAMUSCULAR | Status: AC
  Administered 2021-10-02: 4 mg
  Filled 2021-10-02: qty 2

## 2021-10-02 NOTE — ED Provider Notes (Signed)
[facility] EMERGENCY DEPT Provider Note   CSN: [id number] Arrival date & time: 10/02/21  1341     History  Chief Complaint  Patient presents with   Back Pain    Gabriella Hill is a 26 y.o. female with no significant past medical history who presents with concern for severe back pain, nausea, vomiting.  Patient reports that she has had several episodes of vomiting, and no appetite today.  She does endorse some heavy drinking last night.  She denies any injury to the back.  She denies any numbness, tingling, history of IV drug use, chronic corticosteroid use, history of cancer.  She reports that she has felt some chills without fever, sore throat, cough, runny nose.  She denies any dysuria, urinary frequency, vaginal discharge, vaginal bleeding, dyspareunia.  She reports 1 episode of diarrhea.  No significant constipation, abdominal pain.  She tried 1 ibuprofen 100 mg prior to arrival with minimal relief of her back pain.   Back Pain      Home Medications Prior to Admission medications   Medication Sig Start Date End Date Taking? Authorizing Provider  cefadroxil (DURICEF) 500 MG capsule Take 1 capsule (500 mg total) by mouth 2 (two) times daily. 10/02/21  Yes Bruk Tumolo H, PA-C  benzonatate (TESSALON) 100 MG capsule Take 1 capsule (100 mg total) by mouth every 8 (eight) hours. 03/21/17   Ward, Chase Picket, PA-C  doxycycline (VIBRA-TABS) 100 MG tablet Take 1 tablet (100 mg total) by mouth 2 (two) times daily. 05/31/19   Linwood Dibbles, MD  HYDROcodone-acetaminophen (NORCO/VICODIN) 5-325 MG tablet Take 1 tablet by mouth every 6 (six) hours as needed. 05/31/19   Linwood Dibbles, MD  ondansetron (ZOFRAN ODT) 8 MG disintegrating tablet Take 1 tablet (8 mg total) by mouth every 8 (eight) hours as needed for nausea or vomiting. 05/31/19   Linwood Dibbles, MD      Allergies    Naproxen sodium    Review of Systems   Review of Systems  Musculoskeletal:  Positive for back pain.   All other systems reviewed and are negative.   Physical Exam Updated Vital Signs BP (!) 99/51   Pulse 65   Temp 97.6 F (36.4 C)   Resp 18   Ht 5\' 5"  (1.651 m)   Wt 72.6 kg   LMP 09/21/2021   SpO2 95%   BMI 26.63 kg/m  Physical Exam Vitals and nursing note reviewed.  Constitutional:      General: She is not in acute distress.    Appearance: Normal appearance.  HENT:     Head: Normocephalic and atraumatic.  Eyes:     General:        Right eye: No discharge.        Left eye: No discharge.  Cardiovascular:     Rate and Rhythm: Normal rate and regular rhythm.     Heart sounds: No murmur heard.    No friction rub. No gallop.  Pulmonary:     Effort: Pulmonary effort is normal.     Breath sounds: Normal breath sounds.  Abdominal:     General: Bowel sounds are normal.     Palpations: Abdomen is soft.     Comments: Some diffuse tenderness to palpation of the abdomen  Musculoskeletal:     Comments: Tenderness palpation of paraspinous muscles throughout the back with no significant focal tenderness.  No midline spinal tenderness.  Intact strength 5 of 5 bilateral upper and lower extremities  Skin:  General: Skin is warm and dry.     Capillary Refill: Capillary refill takes less than 2 seconds.  Neurological:     Mental Status: She is alert and oriented to person, place, and time.  Psychiatric:        Mood and Affect: Mood normal.        Behavior: Behavior normal.     ED Results / Procedures / Treatments   Labs (all labs ordered are listed, but only abnormal results are displayed) Labs Reviewed  CBC - Abnormal; Notable for the following components:      Result Value   Hemoglobin 11.8 (*)    HCT 35.7 (*)    Platelets 402 (*)    All other components within normal limits  URINALYSIS, ROUTINE W REFLEX MICROSCOPIC - Abnormal; Notable for the following components:   APPearance HAZY (*)    Protein, ur 30 (*)    Nitrite POSITIVE (*)    Leukocytes,Ua SMALL (*)     Bacteria, UA MANY (*)    All other components within normal limits  URINE CULTURE  COMPREHENSIVE METABOLIC PANEL  LIPASE, BLOOD    EKG None  Radiology No results found.  Procedures Procedures    Medications Ordered in ED Medications  acetaminophen (TYLENOL) tablet 650 mg (has no administration in time range)  morphine (PF) 2 MG/ML injection 2 mg (2 mg Intravenous Given 10/02/21 1433)  sodium chloride 0.9 % bolus 1,000 mL (1,000 mLs Intravenous New Bag/Given 10/02/21 1433)  ondansetron (ZOFRAN) 4 MG/2ML injection (4 mg  Given 10/02/21 1428)    ED Course/ Medical Decision Making/ A&P                           Medical Decision Making Amount and/or Complexity of Data Reviewed Labs: ordered.  Risk OTC drugs. Prescription drug management.   This patient is a 26 y.o. female who presents to the ED for concern of back pain, chills, this involves an extensive number of treatment options, and is a complaint that carries with it a high risk of complications and morbidity. The emergent differential diagnosis prior to evaluation includes, but is not limited to,  back injury, epidural abscess, osteomyelitis, compression fracture, versus urinary or pelvic cause of patient's pain including acute urinary tract infection, pyelonephritis, nephrolithiasis, other intra-abdominal infection versus other.   This is not an exhaustive differential.   Past Medical History / Co-morbidities / Social History: Noncontributory past medical history  Additional history: Chart reviewed. Pertinent results include: Lab work, imaging from recent previous emergency department visits  Physical Exam: Physical exam performed. The pertinent findings include: Patient with some tenderness palpation of the abdomen, as well as tenderness to palpation across the entire back, however no focal tenderness throughout.  She has intact strength throughout all upper and lower extremities, and no neurologic deficits.  She is  vascularly intact throughout.  Lab Tests: I ordered, and personally interpreted labs.  The pertinent results include: Unremarkable CMP, lipase, CBC with very mild anemia, hemoglobin 11.8.  No clinically significant leukocytosis.  Urinalysis with positive nitrate, small leukocytes and many bacteria as well as 11-20 white blood cells.  Especially in context of no other explanation for patient's pain I think that her symptoms are representative of an acute urinary tract infection.   Medications: I ordered medication including fluid bolus given vomiting, other signs of dehydration, morphine for pain, Zofran for nausea. Reevaluation of the patient after these medicines showed that the patient  improved. I have reviewed the patients home medicines and have made adjustments as needed.   Disposition: After consideration of the diagnostic results and the patients response to treatment, I feel that patient stable for discharge at this time, symptoms consistent with UTI.   emergency department workup does not suggest an emergent condition requiring admission or immediate intervention beyond what has been performed at this time. The plan is: Discharge with prescription for cefadroxil, encourage ibuprofen, Tylenol for pain control. The patient is safe for discharge and has been instructed to return immediately for worsening symptoms, change in symptoms or any other concerns.  I discussed this case with my attending physician Dr. Audley Hose who cosigned this note including patient's presenting symptoms, physical exam, and planned diagnostics and interventions. Attending physician stated agreement with plan or made changes to plan which were implemented.    Final Clinical Impression(s) / ED Diagnoses Final diagnoses:  Acute cystitis without hematuria    Rx / DC Orders ED Discharge Orders          Ordered    cefadroxil (DURICEF) 500 MG capsule  2 times daily        10/02/21 1636              Meilyn Heindl,  Edyth Gunnels 10/02/21 1645    Cheryll Cockayne, MD 10/13/21 [phone]

## 2021-10-02 NOTE — ED Notes (Signed)
Unsuccessful attempt at IV right AC.

## 2021-10-02 NOTE — ED Notes (Signed)
Pt has been asked 5 times for urine sample. Still waiting

## 2021-10-02 NOTE — Discharge Instructions (Signed)
Please use Tylenol or ibuprofen for pain.  You may use 600 mg ibuprofen every 6 hours or 1000 mg of Tylenol every 6 hours.  You may choose to alternate between the 2.  This would be most effective.  Not to exceed 4 g of Tylenol within 24 hours.  Not to exceed 3200 mg ibuprofen 24 hours.  Take the entire course of antibiotics we have prescribed.  Please follow-up if your symptoms considerably worsen instead of improve despite treatment, especially if you develop significant fever that does not respond to ibuprofen or Tylenol.

## 2021-10-02 NOTE — ED Notes (Signed)
Pt asked if she could provide urine specimen, she stated she needed "more water." Pt given water.

## 2021-10-02 NOTE — ED Notes (Signed)
Reviewed risk factors for UTI

## 2021-10-02 NOTE — ED Notes (Signed)
Dc instructions reviewed with patient. Patient voiced understanding. Dc with belongings.  °

## 2021-10-02 NOTE — ED Triage Notes (Signed)
Patient c/o back pain with vomiting onset this morning

## 2021-10-05 LAB — URINE CULTURE: Culture: >=100000 — AB

## 2021-10-06 ENCOUNTER — Telehealth: Payer: Self-pay

## 2021-10-06 IMAGING — CT CT ABD-PELV W/ CM
2 of 4 series · 16 of 46 positions shown, 18 images · IV contrast (Omnipaque)
Comparison: CT abdomen pelvis 02/15/2018

CLINICAL DATA: Pelvic and right lower quadrant pain. Vaginal
discharge with pyuria. Leukocytosis.

EXAM:
CT ABDOMEN AND PELVIS WITH CONTRAST
TECHNIQUE: Multidetector CT imaging of the abdomen and pelvis was performed
using the standard protocol following bolus administration of
intravenous contrast.
CONTRAST:  100mL OMNIPAQUE IOHEXOL 300 MG/ML  SOLN

[Series 2: axial (person_name) (person_name) · axial · 0.75mm/px · z∈[+876,+1266]mm · 13 of 86 slices shown, 15 images]
[im 4/86  soft-tissue]
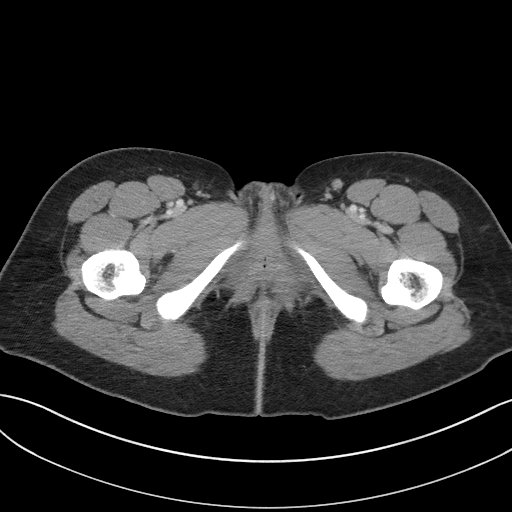
[im 4/86  bone]
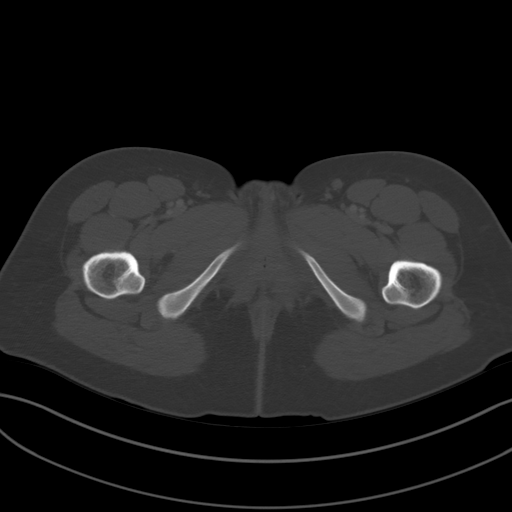
[im 11/86  soft-tissue]
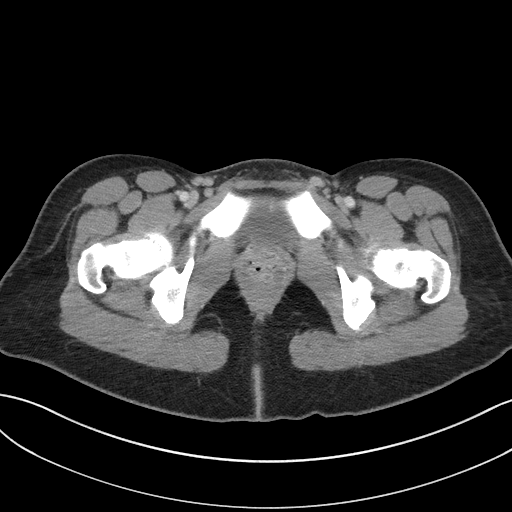
[im 18/86  soft-tissue]
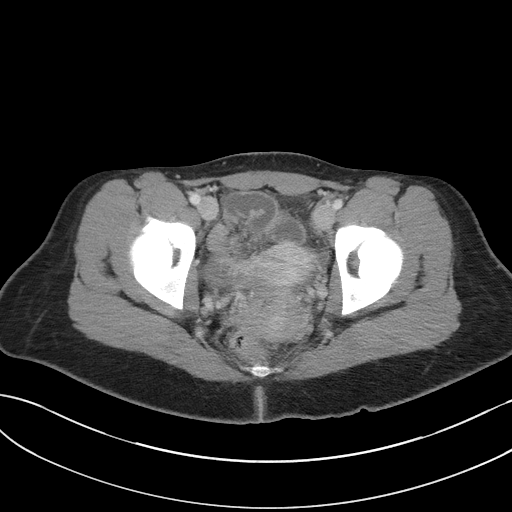
[im 25/86  soft-tissue]
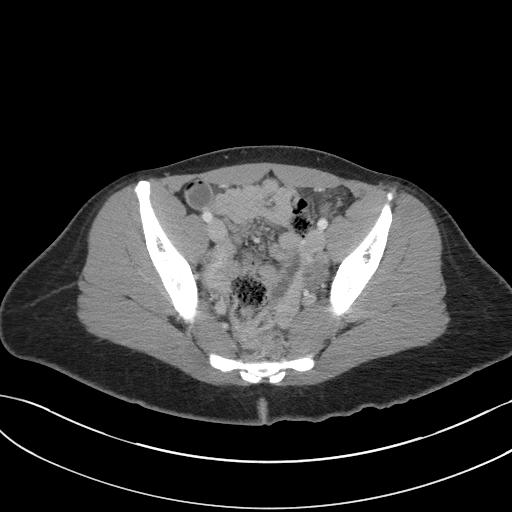
[im 29/86  soft-tissue]
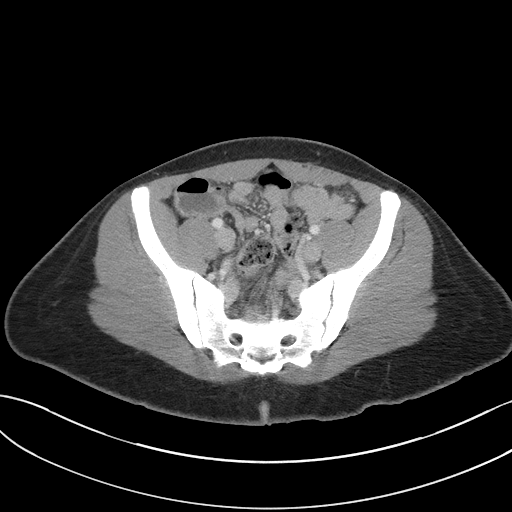
[im 36/86  soft-tissue]
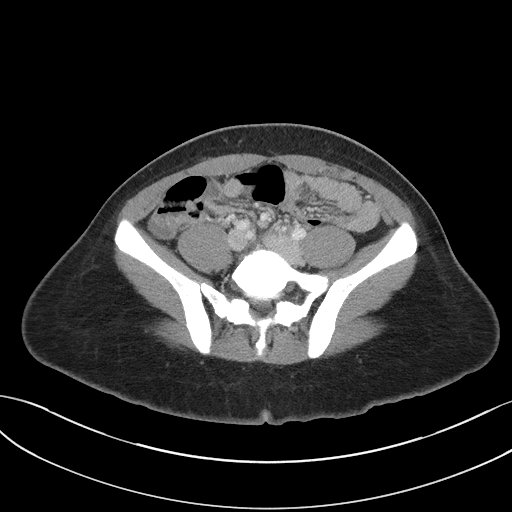
[im 43/86  soft-tissue]
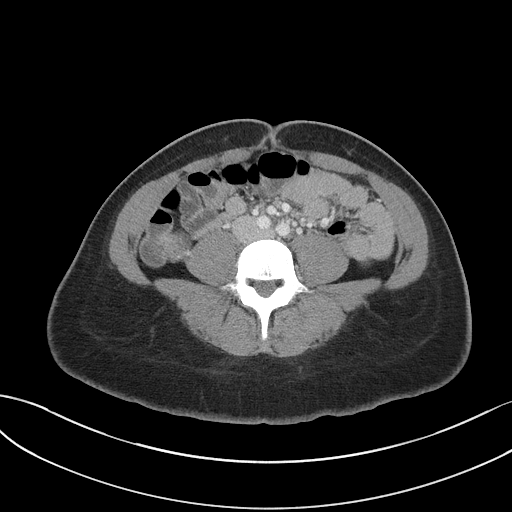
[im 50/86  soft-tissue]
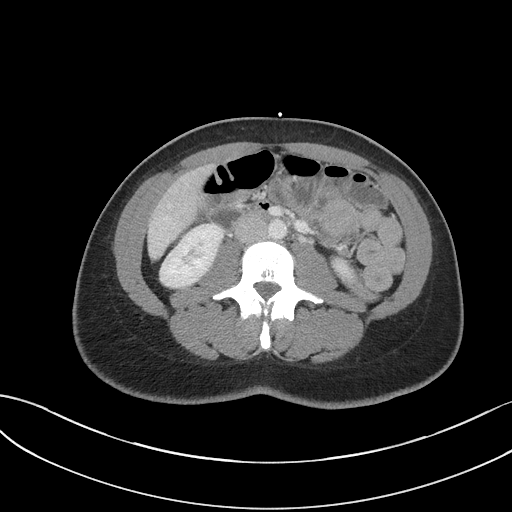
[im 57/86  soft-tissue]
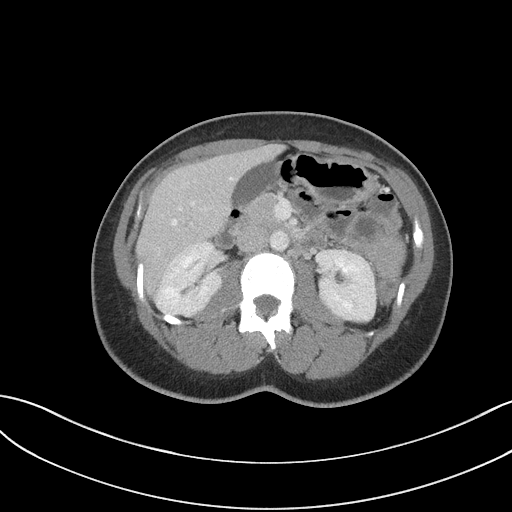
[im 57/86  bone]
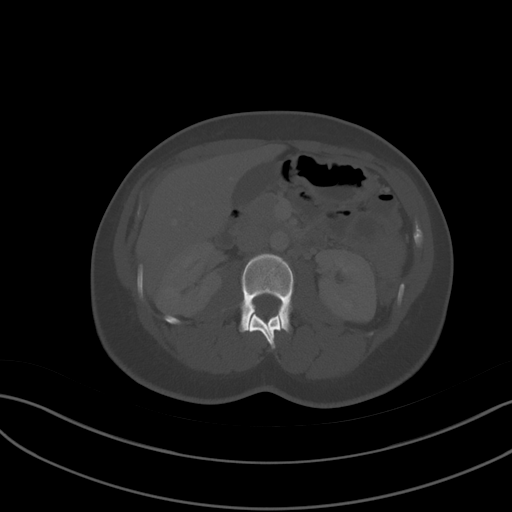
[im 61/86  soft-tissue]
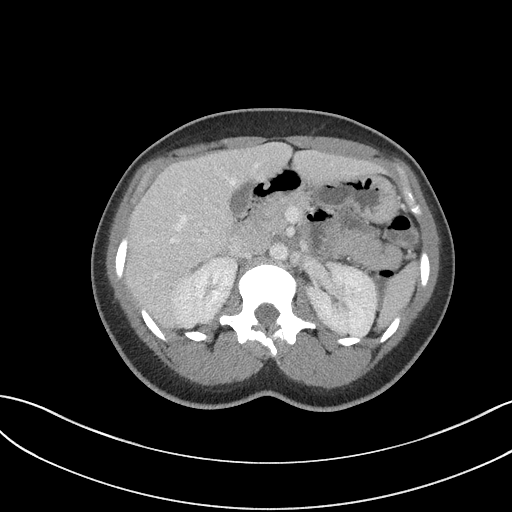
[im 68/86  soft-tissue]
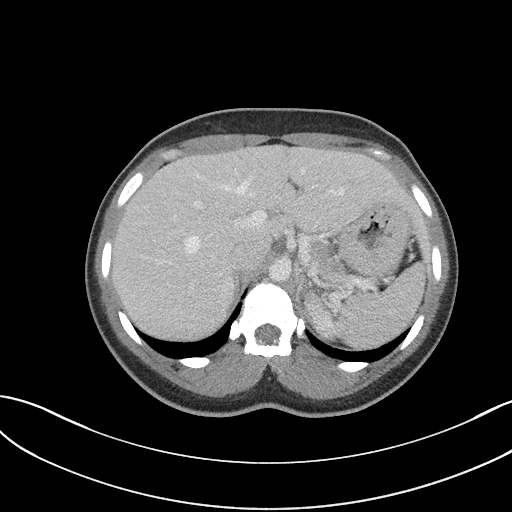
[im 75/86  soft-tissue]
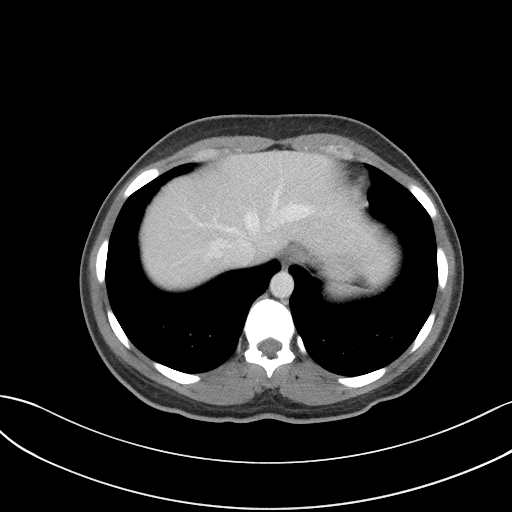
[im 82/86  soft-tissue]
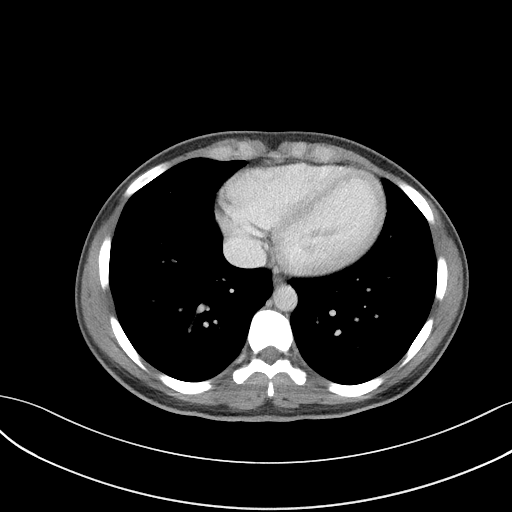

[Series 5: coronal st · coronal · 0.63mm/px · 3 of 79 slices shown]
[im 27/79  soft-tissue]
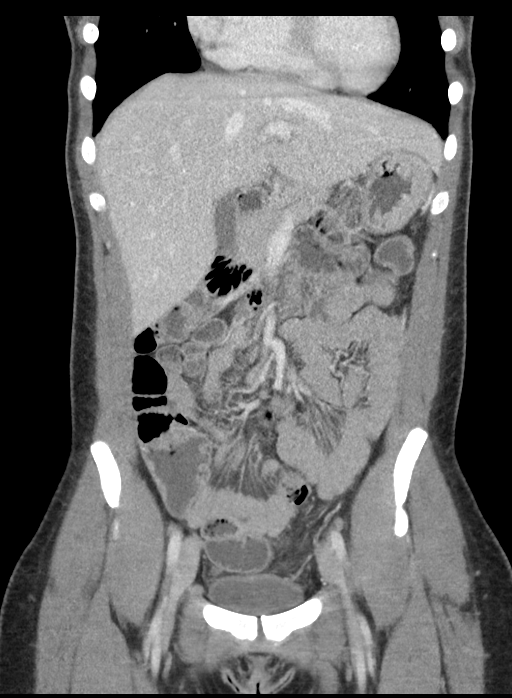
[im 35/79  soft-tissue]
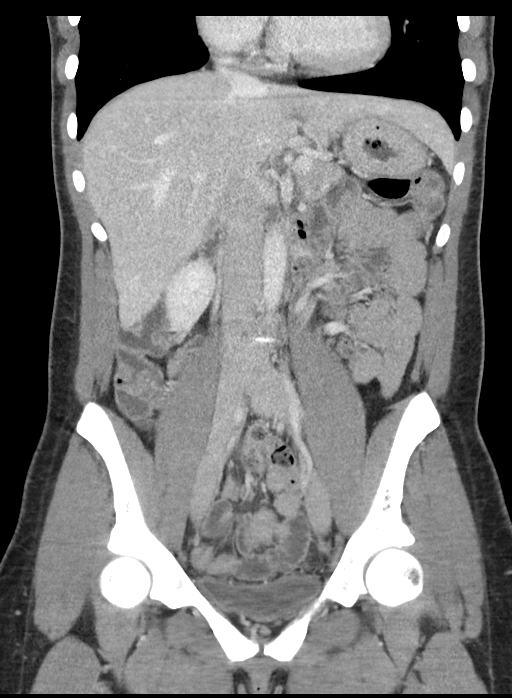
[im 44/79  soft-tissue]
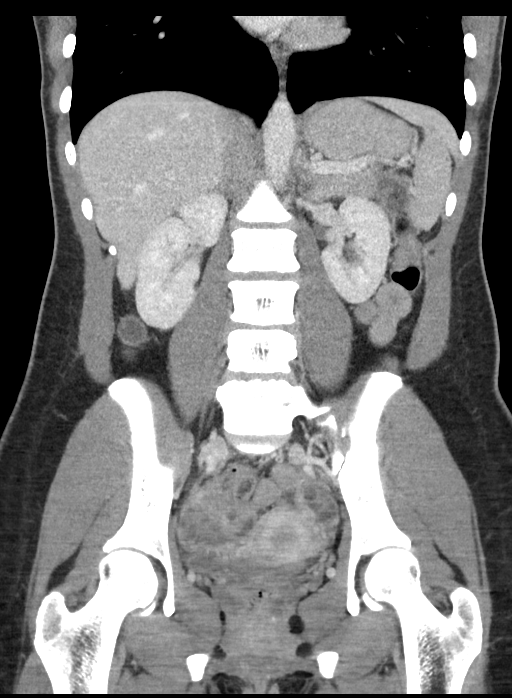

[16 of 46 positions shown; findings below may reference images not displayed]

FINDINGS: Lower chest: Lung bases clear.

Hepatobiliary: No focal liver abnormality is seen. No gallstones,
gallbladder wall thickening, or biliary dilatation.

Pancreas: Negative

Spleen: Negative

Adrenals/Urinary Tract: Adrenal glands are unremarkable. Kidneys are
normal, without renal calculi, focal lesion, or hydronephrosis.
Bladder is unremarkable.

Stomach/Bowel: Negative for bowel obstruction. No bowel mass or
edema. Appendix not visualized.

Vascular/Lymphatic: Negative

Reproductive: 2.3 cm right adnexal cyst with ill-defined borders. 2
cm left adnexal cyst. No free fluid. Normal uterus.

Other: None

Musculoskeletal: Negative
IMPRESSION: Appendix not visualized.  No evidence of acute appendicitis

Bilateral adnexal cysts which may be functional cyst. Given history,
right tubo-ovarian abscess not excluded.

## 2021-10-06 NOTE — Telephone Encounter (Signed)
Post ED Visit - Positive Culture Follow-up  Culture report reviewed by antimicrobial stewardship pharmacist: [facility] [x]  , Pharm.D. []  Carney Living, Pharm.D., BCPS AQ-ID []  , Pharm.D., BCPS []  Celedonio Miyamoto, .D., BCPS []  [place], .D., BCPS, AAHIVP []  Georgina Pillion, Pharm.D., BCPS, AAHIVP []  [address], PharmD, BCPS []  , PharmD, BCPS []  [place], PharmD, BCPS []  [place], PharmD []  , PharmD, BCPS []  Estella Husk, PharmD  Pharmacy Team []  Lysle Pearl, PharmD []  , PharmD []  Phillips Climes, PharmD []  , Rph []  Agapito Games) , PharmD []  Verlan Friends, PharmD []  , PharmD []  Mervyn Gay, PharmD []  , PharmD []  Vinnie Level, PharmD []  [facility], PharmD []  , PharmD []  Len Childs, PharmD   Positive urine culture Treated with Cefadroxil, organism sensitive to the same and no further patient follow-up is required at this time.  10/06/2021, 10:21 AM

## 2021-12-28 ENCOUNTER — Emergency Department (HOSPITAL_COMMUNITY)
Admission: EM | Admit: 2021-12-28 | Discharge: 2021-12-28 | Disposition: A | Discharge status: Home / Self Care | Payer: Self-pay | Attending: Emergency Medicine | Admitting: Emergency Medicine

## 2021-12-28 ENCOUNTER — Other Ambulatory Visit: Payer: Self-pay

## 2021-12-28 DIAGNOSIS — R590 Localized enlarged lymph nodes: Secondary | ICD-10-CM | POA: Insufficient documentation

## 2021-12-28 DIAGNOSIS — Z20822 Contact with and (suspected) exposure to covid-19: Secondary | ICD-10-CM | POA: Insufficient documentation

## 2021-12-28 DIAGNOSIS — R1030 Lower abdominal pain, unspecified: Secondary | ICD-10-CM | POA: Insufficient documentation

## 2021-12-28 DIAGNOSIS — B950 Streptococcus, group A, as the cause of diseases classified elsewhere: Secondary | ICD-10-CM

## 2021-12-28 DIAGNOSIS — J020 Streptococcal pharyngitis: Secondary | ICD-10-CM | POA: Insufficient documentation

## 2021-12-28 DIAGNOSIS — H9201 Otalgia, right ear: Secondary | ICD-10-CM | POA: Insufficient documentation

## 2021-12-28 LAB — GROUP A STREP BY PCR: Group A Strep by PCR: DETECTED — AB

## 2021-12-28 LAB — RESP PANEL BY RT-PCR (FLU A&B, COVID) ARPGX2
Influenza A by PCR: NEGATIVE
Influenza B by PCR: NEGATIVE
SARS Coronavirus 2 by RT PCR: NEGATIVE

## 2021-12-28 MED ORDER — AMOXICILLIN 500 MG PO TABS: 1000.0000 mg | ORAL_TABLET | Freq: Every day | ORAL | 0 refills | Status: AC

## 2021-12-28 NOTE — Discharge Instructions (Addendum)
Take tylenol as needed to help with throat pain, this should improve with antibiotics

## 2021-12-28 NOTE — ED Provider Triage Note (Signed)
Emergency Medicine Provider Triage Evaluation Note  Gabriella Hill , a 26 y.o. female  was evaluated in triage.  Pt complains of sore throat, right ear pain.  Just started back to work at [employer] recently.  Denies sick contacts.  Review of Systems  Positive: Sore throat, ear pain Negative: fever  Physical Exam  BP (!) 111/55 (BP Location: Right Arm)   Pulse 68   Temp 98.6 F (37 C) (Oral)   Resp 18   SpO2 100%  Gen:   Awake, no distress   Resp:  Normal effort  MSK:   Moves extremities without difficulty  Other:  Tonsils overall normal in appearance bilaterally without exudate; uvula midline without evidence of peritonsillar abscess; handling secretions appropriately; no difficulty swallowing or speaking; normal phonation without stridor   Medical Decision Making  Medically screening exam initiated at 6:12 AM.  Appropriate orders placed.  Debroah Shuttleworth was informed that the remainder of the evaluation will be completed by another provider, this initial triage assessment does not replace that evaluation, and the importance of remaining in the ED until their evaluation is complete.  Sore throat, ear pain.  VSS.  Rapid strep, covid/flu screen sent.   Larene Pickett, PA-C 12/28/21 [phone]

## 2021-12-28 NOTE — ED Triage Notes (Signed)
Sore throat, right ear pain. Endorses shortness of breath, no distress, lung sounds clear, denies cough or fevers.

## 2021-12-28 NOTE — ED Provider Notes (Addendum)
[facility] EMERGENCY DEPARTMENT Provider Note   CSN: [id number] Arrival date & time: 12/28/21  0602     History  Chief Complaint  Patient presents with   Sore Throat    Gabriella Hill is a 26 y.o. female.  Patient started having throat pain and right ear pain along with lower abdominal pain that started around 2 to 3 AM this morning.    Sore throat She has not been having any cough but feels like sometimes she has a little bit more trouble breathing.  She does not take any medications at home did not take any meds before coming here. No fevers that she knows of.  She has had group A strep infection before.  Does not have any antibiotic allergies.  R ear pain Her right ear has been hurting since this morning at 2 to 3 AM.  She is also having tenderness in her right jaw area and periauricular area starting this morning.  Denies any recent dental procedures or dental pain.  Lower abdominal pain She has been having a cramping pain in her lower abdomen since this morning as well.  She denies any vomiting or diarrhea.  She denies any vaginal discharge.  Denies any urinary symptoms/flank pain. Her last menstrual period ended 2 days ago on 25 September.       Sore Throat Associated symptoms include abdominal pain. Pertinent negatives include no chest pain.       Home Medications Prior to Admission medications   Medication Sig Start Date End Date Taking? Authorizing Provider  benzonatate (TESSALON) 100 MG capsule Take 1 capsule (100 mg total) by mouth every 8 (eight) hours. 03/21/17   Ward, Chase Picket, PA-C  cefadroxil (DURICEF) 500 MG capsule Take 1 capsule (500 mg total) by mouth 2 (two) times daily. 10/02/21   Prosperi, Christian H, PA-C  doxycycline (VIBRA-TABS) 100 MG tablet Take 1 tablet (100 mg total) by mouth 2 (two) times daily. 05/31/19   Linwood Dibbles, MD  HYDROcodone-acetaminophen (NORCO/VICODIN) 5-325 MG tablet Take 1 tablet by mouth every 6 (six)  hours as needed. 05/31/19   Linwood Dibbles, MD  ondansetron (ZOFRAN ODT) 8 MG disintegrating tablet Take 1 tablet (8 mg total) by mouth every 8 (eight) hours as needed for nausea or vomiting. 05/31/19   Linwood Dibbles, MD      Allergies    Naproxen sodium    Review of Systems   Review of Systems  Constitutional:  Positive for activity change, appetite change and fatigue. Negative for fever.  HENT:  Positive for congestion, ear pain and sore throat.   Eyes:  Negative for redness.  Respiratory:  Negative for cough.   Cardiovascular:  Negative for chest pain.  Gastrointestinal:  Positive for abdominal pain. Negative for abdominal distention, diarrhea and vomiting.  Genitourinary:  Negative for dysuria, flank pain and vaginal discharge.  Skin:  Negative for rash.  Hematological:  Positive for adenopathy.    Physical Exam Updated Vital Signs BP (!) 111/55 (BP Location: Right Arm)   Pulse 68   Temp 98.6 F (37 C) (Oral)   Resp 18   SpO2 100%  Physical Exam Constitutional:      General: She is not in acute distress.    Appearance: She is not toxic-appearing.  HENT:     Head: Normocephalic and atraumatic.     Right Ear: Tympanic membrane and ear canal normal. No drainage.     Left Ear: Tympanic membrane and ear canal normal. No drainage.  Nose: Congestion present.     Mouth/Throat:     Mouth: Mucous membranes are moist. No oral lesions.     Pharynx: Uvula midline. No oropharyngeal exudate or uvula swelling.     Tonsils: No tonsillar exudate or tonsillar abscesses. 1+ on the right. 1+ on the left.  Eyes:     Conjunctiva/sclera: Conjunctivae normal.     Pupils: Pupils are equal, round, and reactive to light.  Neck:     Comments: Tenderness to palpation near right periauricular area and under right jaw, no fluctuance or warmth. Dentition appropriate Cardiovascular:     Rate and Rhythm: Normal rate and regular rhythm.     Heart sounds: Normal heart sounds. No murmur heard.    No  friction rub. No gallop.  Pulmonary:     Effort: Pulmonary effort is normal.     Breath sounds: Normal breath sounds.  Abdominal:     General: There is no distension.     Palpations: Abdomen is soft. There is no mass.     Tenderness: There is abdominal tenderness. There is no guarding.     Comments: Lower abdominal tenderness mild to palaption  Musculoskeletal:     Cervical back: Normal range of motion.  Lymphadenopathy:     Cervical: Cervical adenopathy present.  Skin:    General: Skin is warm and dry.     Capillary Refill: Capillary refill takes less than 2 seconds.     Findings: No rash.  Neurological:     General: No focal deficit present.     Mental Status: She is alert.  Psychiatric:        Mood and Affect: Mood normal.     ED Results / Procedures / Treatments   Labs (all labs ordered are listed, but only abnormal results are displayed) Labs Reviewed  GROUP A STREP BY PCR - Abnormal; Notable for the following components:      Result Value   Group A Strep by PCR DETECTED (*)    All other components within normal limits  RESP PANEL BY RT-PCR (FLU A&B, COVID) ARPGX2    EKG None  Radiology No results found.  Procedures Procedures   Medications Ordered in ED Medications - No data to display  ED Course/ Medical Decision Making/ A&P                           Medical Decision Making 26 year old here with sore throat right ear pain and abdominal pain likely all related to group A strep infection. Negative covid/flu. Overall well-hydrated on examination breathing appropriately.  No evidence of peritonsillar abscess.  Prescribed patient amoxicillin course for 10 days.  Return precautions and handout provided. Patient stable for discharge with outpatient PCP follow up as needed.  Risk Prescription drug management.  Final Clinical Impression(s) / ED Diagnoses Final diagnoses:  None    Rx / DC Orders ED Discharge Orders     None         Gerrit Heck,  MD 12/28/21 0109    Gerrit Heck, MD 12/28/21 3235    Elnora Morrison, MD 12/28/21 [phone]

## 2021-12-30 ENCOUNTER — Other Ambulatory Visit: Payer: Self-pay

## 2021-12-30 ENCOUNTER — Emergency Department (HOSPITAL_BASED_OUTPATIENT_CLINIC_OR_DEPARTMENT_OTHER)
Admission: EM | Admit: 2021-12-30 | Discharge: 2021-12-30 | Disposition: A | Discharge status: Home / Self Care | Payer: Self-pay | Attending: Emergency Medicine | Admitting: Emergency Medicine

## 2021-12-30 DIAGNOSIS — J020 Streptococcal pharyngitis: Secondary | ICD-10-CM | POA: Insufficient documentation

## 2021-12-30 MED ORDER — DEXAMETHASONE SODIUM PHOSPHATE 10 MG/ML IJ SOLN
10.0000 mg | Freq: Once | INTRAMUSCULAR | Status: AC
  Administered 2021-12-30: 10 mg via INTRAVENOUS
  Filled 2021-12-30: qty 1

## 2021-12-30 MED ORDER — DEXAMETHASONE SODIUM PHOSPHATE 10 MG/ML IJ SOLN
10.0000 mg | Freq: Once | INTRAMUSCULAR | Status: AC
  Administered 2021-12-30: 10 mg via INTRAMUSCULAR

## 2021-12-30 NOTE — ED Provider Notes (Signed)
[facility] EMERGENCY DEPARTMENT Provider Note   CSN: [id number] Arrival date & time: 12/30/21  0909     History  Chief Complaint  Patient presents with   Sore Throat    Gabriella Hill is a 26 y.o. female.  Patient no significant past medical history- presents for re-evaluation of sore throat and ear pain.  Patient was seen in the emergency department on 12/28/2021 for the same symptoms.  She had negative COVID/flu testing at that time and positive strep test.  She was started on amoxicillin p.o.  Today is day 3 of illness.  Denies fever, cough, shortness of breath, chills, or body aches at time of being seen. Now having worsened sore throat and right ear pain. Has lost voice almost entirely and has severe pain when trying to open mouth to talk, eat, or drink. Right ear is tender externally but no change in hearing, no drainage. Has not taken any other medications to try to help throat or ear other than amoxicillin. Denies fevers or cough today but feels throat is progressively worsening and making her feel worse. States she gets strep yearly. Denies any sick contacts at home or work, no kids in home. Allergic to naproxen.        Home Medications Prior to Admission medications   Medication Sig Start Date End Date Taking? Authorizing Provider  amoxicillin (AMOXIL) 500 MG tablet Take 2 tablets (1,000 mg total) by mouth daily for 10 days. 12/28/21 01/07/22 Yes Gerrit Heck, MD      Allergies    Naproxen sodium    Review of Systems   Review of Systems  Physical Exam Updated Vital Signs BP 118/65 (BP Location: Right Arm)   Pulse 89   Temp 99.1 F (37.3 C) (Oral)   Resp 16   Ht 5\' 5"  (1.651 m)   Wt 70.8 kg   LMP 12/22/2021   SpO2 99%   BMI 25.96 kg/m   Physical Exam Vitals and nursing note reviewed.  Constitutional:      Appearance: She is well-developed.  HENT:     Head: Normocephalic and atraumatic.     Jaw: No trismus.     Right Ear: Tympanic  membrane, ear canal and external ear normal.     Left Ear: Tympanic membrane, ear canal and external ear normal.     Nose: Nose normal. No mucosal edema or rhinorrhea.     Mouth/Throat:     Mouth: Mucous membranes are moist. Mucous membranes are not dry. No oral lesions.     Pharynx: Uvula midline. Oropharyngeal exudate and posterior oropharyngeal erythema present. No uvula swelling.     Tonsils: No tonsillar abscesses.     Comments: Exudate and swelling R tonsil > L tonsil; no sign of peritonsillar abscess. Eyes:     General:        Right eye: No discharge.        Left eye: No discharge.     Conjunctiva/sclera: Conjunctivae normal.  Cardiovascular:     Rate and Rhythm: Normal rate and regular rhythm.     Heart sounds: Normal heart sounds.  Pulmonary:     Effort: Pulmonary effort is normal. No respiratory distress.     Breath sounds: Normal breath sounds. No wheezing or rales.  Abdominal:     Palpations: Abdomen is soft.     Tenderness: There is no abdominal tenderness.  Musculoskeletal:     Cervical back: Normal range of motion and neck supple.  Lymphadenopathy:  Cervical: No cervical adenopathy.  Skin:    General: Skin is warm and dry.  Neurological:     Mental Status: She is alert.  Psychiatric:        Mood and Affect: Mood normal.     ED Results / Procedures / Treatments   Labs (all labs ordered are listed, but only abnormal results are displayed) Labs Reviewed - No data to display  EKG None  Radiology No results found.  Procedures Procedures    Medications Ordered in ED Medications  dexamethasone (DECADRON) injection 10 mg (has no administration in time range)    ED Course/ Medical Decision Making/ A&P    Patient seen and examined. History obtained directly from patient. Work-up including labs, imaging, EKG ordered in triage, if performed, were reviewed.    Labs/EKG: Independently reviewed and interpreted.  This included: Strep testing and COVID  testing from previous visit.  Imaging: Independently reviewed and interpreted.  None  Medications/Fluids: IM Decadron, consider administration of IM Toradol patient has NSAID allergy.  Most recent vital signs reviewed and are as follows: BP 118/65 (BP Location: Right Arm)   Pulse 89   Temp 99.1 F (37.3 C) (Oral)   Resp 16   Ht 5\' 5"  (1.651 m)   Wt 70.8 kg   LMP 12/22/2021   SpO2 99%   BMI 25.96 kg/m   Initial impression: Streptococcal pharyngitis/tonsillitis, no sign of peritonsillar abscess or deep space infection in the neck at this time  Home treatment plan: Continued hydration, OTC meds, rest  Return instructions discussed with patient: Worsening pain in the throat, inability to swallow, difficulty breathing  Follow-up instructions discussed with patient: Follow-up with PCP in 72 hours if not improving                            Medical Decision Making  In regards to the patient's sore throat today, the following dangerous and potentially life threatening etiologies were considered on the differential diagnosis: Lugwig's angina, uvulitis, epiglottis, peritonsillar abscess, retropharyngeal abscess, Lemierre's syndrome. Also considered were more common causes such as: streptococcal pharyngitis, gonococcal pharyngitis, non-bacterial pharyngitis (cold viruses, HSV/coxsackievirus, influenza, COVID-19, infectious mononucleosis, oropharyngeal candidiasis), and other non-infectious causes including seasonal allergies/post-nasal drip, GERD/esophagitis, trauma.   The patient's vital signs, pertinent lab work and imaging were reviewed and interpreted as discussed in the ED course. Hospitalization was considered for further testing, treatments, or serial exams/observation. However as patient is well-appearing, has a stable exam, and reassuring studies today, I do not feel that they warrant admission at this time. This plan was discussed with the patient who verbalizes agreement and comfort  with this plan and seems reliable and able to return to the Emergency Department with worsening or changing symptoms.          Final Clinical Impression(s) / ED Diagnoses Final diagnoses:  Strep pharyngitis    Rx / DC Orders ED Discharge Orders     None         12/24/2021, PA-C 12/30/21 01/01/22    1025, MD 12/30/21 1247

## 2021-12-30 NOTE — Discharge Instructions (Addendum)
Please read and follow all provided instructions.  Your diagnoses today include:  1. Strep pharyngitis     Tests performed today include: Vital signs. See below for your results today.   Medications prescribed:  You were given a dose of decadron to help with the swelling and pain.   Take any medications prescribed only as directed.   Home care instructions:  Please read the educational materials provided and follow any instructions contained in this packet.  Follow-up instructions: Please follow-up with your primary care provider as needed for further evaluation of your symptoms.  Return instructions:  Please return to the Emergency Department if you experience worsening symptoms.  Return if you have worsening problems swallowing, your neck becomes swollen, you cannot swallow your saliva or your voice becomes muffled.  Return with high persistent fever, persistent vomiting, or if you have trouble breathing.  Please return if you have any other emergent concerns.  Additional Information:  Your vital signs today were: BP 118/65 (BP Location: Right Arm)   Pulse 89   Temp 99.1 F (37.3 C) (Oral)   Resp 16   Ht 5\' 5"  (1.651 m)   Wt 70.8 kg   LMP 12/22/2021   SpO2 99%   BMI 25.96 kg/m  If your blood pressure (BP) was elevated above 135/85 this visit, please have this repeated by your doctor within one month. --------------

## 2021-12-30 NOTE — ED Notes (Signed)
Given  popsicle

## 2021-12-30 NOTE — ED Triage Notes (Signed)
States was dx with strep x 3 days and was started on amoxicillin. Pain worse to throat and having right ear pain

## 2021-12-30 NOTE — ED Notes (Signed)
ED Provider at bedside. 

## 2023-05-18 ENCOUNTER — Emergency Department (HOSPITAL_COMMUNITY)
Admission: EM | Admit: 2023-05-18 | Discharge: 2023-05-18 | Disposition: A | Discharge status: Home / Self Care | Payer: Self-pay | Attending: Emergency Medicine | Admitting: Emergency Medicine

## 2023-05-18 ENCOUNTER — Encounter (HOSPITAL_COMMUNITY): Payer: Self-pay

## 2023-05-18 ENCOUNTER — Other Ambulatory Visit: Payer: Self-pay

## 2023-05-18 DIAGNOSIS — J101 Influenza due to other identified influenza virus with other respiratory manifestations: Secondary | ICD-10-CM | POA: Insufficient documentation

## 2023-05-18 DIAGNOSIS — J111 Influenza due to unidentified influenza virus with other respiratory manifestations: Secondary | ICD-10-CM

## 2023-05-18 LAB — RESP PANEL BY RT-PCR (RSV, FLU A&B, COVID)  RVPGX2
Influenza A by PCR: POSITIVE — AB
Influenza B by PCR: NEGATIVE
Resp Syncytial Virus by PCR: NEGATIVE
SARS Coronavirus 2 by RT PCR: NEGATIVE

## 2023-05-18 MED ORDER — ONDANSETRON 4 MG PO TBDP: 4.0000 mg | ORAL_TABLET | Freq: Three times a day (TID) | ORAL | 0 refills | Status: AC | PRN

## 2023-05-18 MED ORDER — ACETAMINOPHEN 325 MG PO TABS
650.0000 mg | ORAL_TABLET | Freq: Once | ORAL | Status: AC
  Administered 2023-05-18: 650 mg via ORAL
  Filled 2023-05-18: qty 2

## 2023-05-18 MED ORDER — GUAIFENESIN 100 MG/5ML PO LIQD
10.0000 mL | Freq: Once | ORAL | Status: DC
  Filled 2023-05-18: qty 10

## 2023-05-18 MED ORDER — BENZONATATE 100 MG PO CAPS
100.0000 mg | ORAL_CAPSULE | Freq: Once | ORAL | Status: AC
  Administered 2023-05-18: 100 mg via ORAL
  Filled 2023-05-18: qty 1

## 2023-05-18 MED ORDER — GUAIFENESIN 100 MG/5ML PO LIQD: 10.0000 mL | Freq: Four times a day (QID) | ORAL | 0 refills | Status: AC | PRN

## 2023-05-18 MED ORDER — ACETAMINOPHEN 325 MG PO TABS: 625.0000 mg | ORAL_TABLET | Freq: Four times a day (QID) | ORAL | 0 refills | Status: AC | PRN

## 2023-05-18 NOTE — ED Triage Notes (Signed)
Pt c/o body aches, cough, congestion, and vomiting for past week. Pt has taken mucinex w/o relief.

## 2023-05-18 NOTE — Discharge Instructions (Addendum)
As discussed, you tested positive for the flu.  I have sent prescriptions to your pharmacy.  Take Tylenol every 6 hours for body aches.  Take Robitussin every 6 hours as needed as well for cough.  You can take Zofran every 8 hours for nausea/vomiting.  Sure you are drinking plenty of fluids, it is okay if you are not hungry as long as you are drinking.  Drink things with electrolytes like Pedialyte, Powerade, or Gatorade.   Symptoms usually last about a week.  You should start feeling better shortly with the medications.  Get help right away if: You become short of breath or have trouble breathing. Your skin or nails turn blue. You have very bad pain or stiffness in your neck. You get a sudden headache or pain in your face or ear. You vomit each time you eat or drink.

## 2023-05-18 NOTE — ED Provider Notes (Signed)
[facility] EMERGENCY DEPARTMENT AT [facility] Provider Note   CSN: [id number] Arrival date & time: 05/18/23  1012     History  Chief Complaint  Patient presents with   Generalized Body Aches    Gabriella Hill is a 28 y.o. female with no significant past medical history presents the ED today for body aches.  Patient reports body aches, cough, congestion and vomiting for the past week.  No fevers or diarrhea.  She tried taking Mucinex once without any relief of symptoms, she has not tried anything else for symptom relief. Endorses loss of appetite as well.  No additional complaints or concerns at this time.    Home Medications Prior to Admission medications   Medication Sig Start Date End Date Taking? Authorizing Provider  acetaminophen (TYLENOL) 325 MG tablet Take 2 tablets (650 mg total) by mouth every 6 (six) hours as needed for up to 7 days. 05/18/23 05/25/23 Yes Maxwell Marion, PA-C  guaiFENesin (ROBITUSSIN) 100 MG/5ML liquid Take 10 mLs by mouth every 6 (six) hours as needed for up to 7 days for cough or to loosen phlegm. 05/18/23 05/25/23 Yes Maxwell Marion, PA-C  ondansetron (ZOFRAN-ODT) 4 MG disintegrating tablet Take 1 tablet (4 mg total) by mouth every 8 (eight) hours as needed for up to 7 days for nausea or vomiting. 05/18/23 05/25/23 Yes Maxwell Marion, PA-C      Allergies    Naproxen sodium    Review of Systems   Review of Systems  Constitutional:  Positive for fatigue.  All other systems reviewed and are negative.   Physical Exam Updated Vital Signs BP 104/80   Pulse 60   Temp 98.6 F (37 C)   Resp 18   Ht 5\' 5"  (1.651 m)   Wt 71 kg   LMP 05/18/2023 (Exact Date)   SpO2 100%   BMI 26.05 kg/m  Physical Exam Vitals and nursing note reviewed.  Constitutional:      Appearance: Normal appearance. She is ill-appearing.  HENT:     Head: Normocephalic and atraumatic.     Mouth/Throat:     Mouth: Mucous membranes are moist.  Eyes:      Conjunctiva/sclera: Conjunctivae normal.     Pupils: Pupils are equal, round, and reactive to light.  Cardiovascular:     Rate and Rhythm: Normal rate and regular rhythm.     Pulses: Normal pulses.     Heart sounds: Normal heart sounds.  Pulmonary:     Effort: Pulmonary effort is normal.     Breath sounds: Normal breath sounds.  Abdominal:     Palpations: Abdomen is soft.     Tenderness: There is no abdominal tenderness.  Skin:    General: Skin is warm and dry.     Findings: No rash.  Neurological:     General: No focal deficit present.     Mental Status: She is alert.  Psychiatric:        Mood and Affect: Mood normal.        Behavior: Behavior normal.     ED Results / Procedures / Treatments   Labs (all labs ordered are listed, but only abnormal results are displayed) Labs Reviewed  RESP PANEL BY RT-PCR (RSV, FLU A&B, COVID)  RVPGX2 - Abnormal; Notable for the following components:      Result Value   Influenza A by PCR POSITIVE (*)    All other components within normal limits    EKG None  Radiology No results found.  Procedures Procedures: not indicated.   Medications Ordered in ED Medications  acetaminophen (TYLENOL) tablet 650 mg (650 mg Oral Given 05/18/23 1257)  benzonatate (TESSALON) capsule 100 mg (100 mg Oral Given 05/18/23 1258)    ED Course/ Medical Decision Making/ A&P                                 Medical Decision Making Risk OTC drugs.   This patient presents to the ED for concern of body aches and cough, this involves an extensive number of treatment options, and is a complaint that carries with it a high risk of complications and morbidity.   Differential diagnosis includes: Flu, COVID, RSV, other viral illness, etc. Low suspicion for pneumonia, patient's lungs are clear to auscultation and there is no fever.   Comorbidities  No significant past medical history   Additional History  Additional history obtained from prior  records   Lab Tests  I ordered and personally interpreted labs.  The pertinent results include:   Respiratory panel positive for flu A   Problem List / ED Course / Critical Interventions / Medication Management  Cough, body aches, chills, nausea/vomiting, and congestion x 1 week.  She took Mucinex at home 1 time without any improvement of her symptoms.  States her symptoms have been worsening so came in today for evaluation. I ordered medications including: Tylenol for body aches Benzonatate for cough Medications given prior to discharge I have reviewed the patients home medicines and have made adjustments as needed Patient unable to take ibuprofen due to naproxen allergy.  Patient states that her throat started swelling last time she took it.  Advised to take Tylenol every 6-8 hours for symptoms.   Social Determinants of Health  Access to healthcare   Test / Admission - Considered  Patient is hemodynamically stable and safe for discharge home. Return precautions provided.       Final Clinical Impression(s) / ED Diagnoses Final diagnoses:  Flu    Rx / DC Orders ED Discharge Orders          Ordered    acetaminophen (TYLENOL) 325 MG tablet  Every 6 hours PRN        05/18/23 1247    ondansetron (ZOFRAN-ODT) 4 MG disintegrating tablet  Every 8 hours PRN        05/18/23 1247    guaiFENesin (ROBITUSSIN) 100 MG/5ML liquid  Every 6 hours PRN        05/18/23 1247              Maxwell Marion, PA-C 05/18/23 1315    Alvira Monday, MD 05/18/23 2238
